# Patient Record
Sex: Male | Born: 1955
Health system: Southern US, Community
[De-identification: ages and names within clinical notes are randomized; demographics above are authoritative.]

## PROBLEM LIST (undated history)

## (undated) DIAGNOSIS — I10 Essential (primary) hypertension: Secondary | ICD-10-CM

## (undated) DIAGNOSIS — I2510 Atherosclerotic heart disease of native coronary artery without angina pectoris: Secondary | ICD-10-CM

## (undated) DIAGNOSIS — K219 Gastro-esophageal reflux disease without esophagitis: Secondary | ICD-10-CM

## (undated) DIAGNOSIS — E785 Hyperlipidemia, unspecified: Secondary | ICD-10-CM

## (undated) DIAGNOSIS — L570 Actinic keratosis: Secondary | ICD-10-CM

## (undated) DIAGNOSIS — C801 Malignant (primary) neoplasm, unspecified: Secondary | ICD-10-CM

## (undated) DIAGNOSIS — I219 Acute myocardial infarction, unspecified: Secondary | ICD-10-CM

## (undated) HISTORY — PX: MELANOMA EXCISION: SHX5266

## (undated) HISTORY — DX: Actinic keratosis: L57.0

## (undated) HISTORY — PX: CORONARY ARTERY BYPASS GRAFT: SHX141

## (undated) HISTORY — PX: MICROLARYNGOSCOPY WITH CO2 LASER AND EXCISION OF VOCAL CORD LESION: SHX5970

---

## 2001-05-15 ENCOUNTER — Encounter: Admission: RE | Admit: 2001-05-15 | Discharge: 2001-06-05 | Payer: Self-pay | Admitting: Otolaryngology

## 2001-08-18 ENCOUNTER — Ambulatory Visit (HOSPITAL_BASED_OUTPATIENT_CLINIC_OR_DEPARTMENT_OTHER): Admission: RE | Admit: 2001-08-18 | Discharge: 2001-08-18 | Payer: Self-pay | Admitting: Otolaryngology

## 2001-09-04 ENCOUNTER — Encounter: Admission: RE | Admit: 2001-09-04 | Discharge: 2001-09-05 | Payer: Self-pay | Admitting: Otolaryngology

## 2004-10-07 ENCOUNTER — Inpatient Hospital Stay: Payer: Self-pay | Admitting: Cardiology

## 2004-10-08 ENCOUNTER — Inpatient Hospital Stay (HOSPITAL_COMMUNITY)
Admission: AD | Admit: 2004-10-08 | Discharge: 2004-10-14 | Payer: Self-pay | Admitting: Thoracic Surgery (Cardiothoracic Vascular Surgery)

## 2004-11-05 ENCOUNTER — Encounter
Admission: RE | Admit: 2004-11-05 | Discharge: 2004-11-05 | Payer: Self-pay | Admitting: Thoracic Surgery (Cardiothoracic Vascular Surgery)

## 2004-11-17 ENCOUNTER — Encounter: Payer: Self-pay | Admitting: Cardiology

## 2004-12-16 ENCOUNTER — Encounter: Payer: Self-pay | Admitting: Cardiology

## 2005-01-13 ENCOUNTER — Encounter: Payer: Self-pay | Admitting: Cardiology

## 2005-01-25 ENCOUNTER — Encounter: Payer: Self-pay | Admitting: Cardiology

## 2006-06-01 IMAGING — CR DG CHEST 1V PORT
1 series · 1 of 1 positions shown · non-contrast
Comparison: 10/08/04.

CLINICAL DATA: Postop CABG.  
 PORTABLE CHEST 10/09/04:
 Exam at 2399 hours.

[view not recorded]
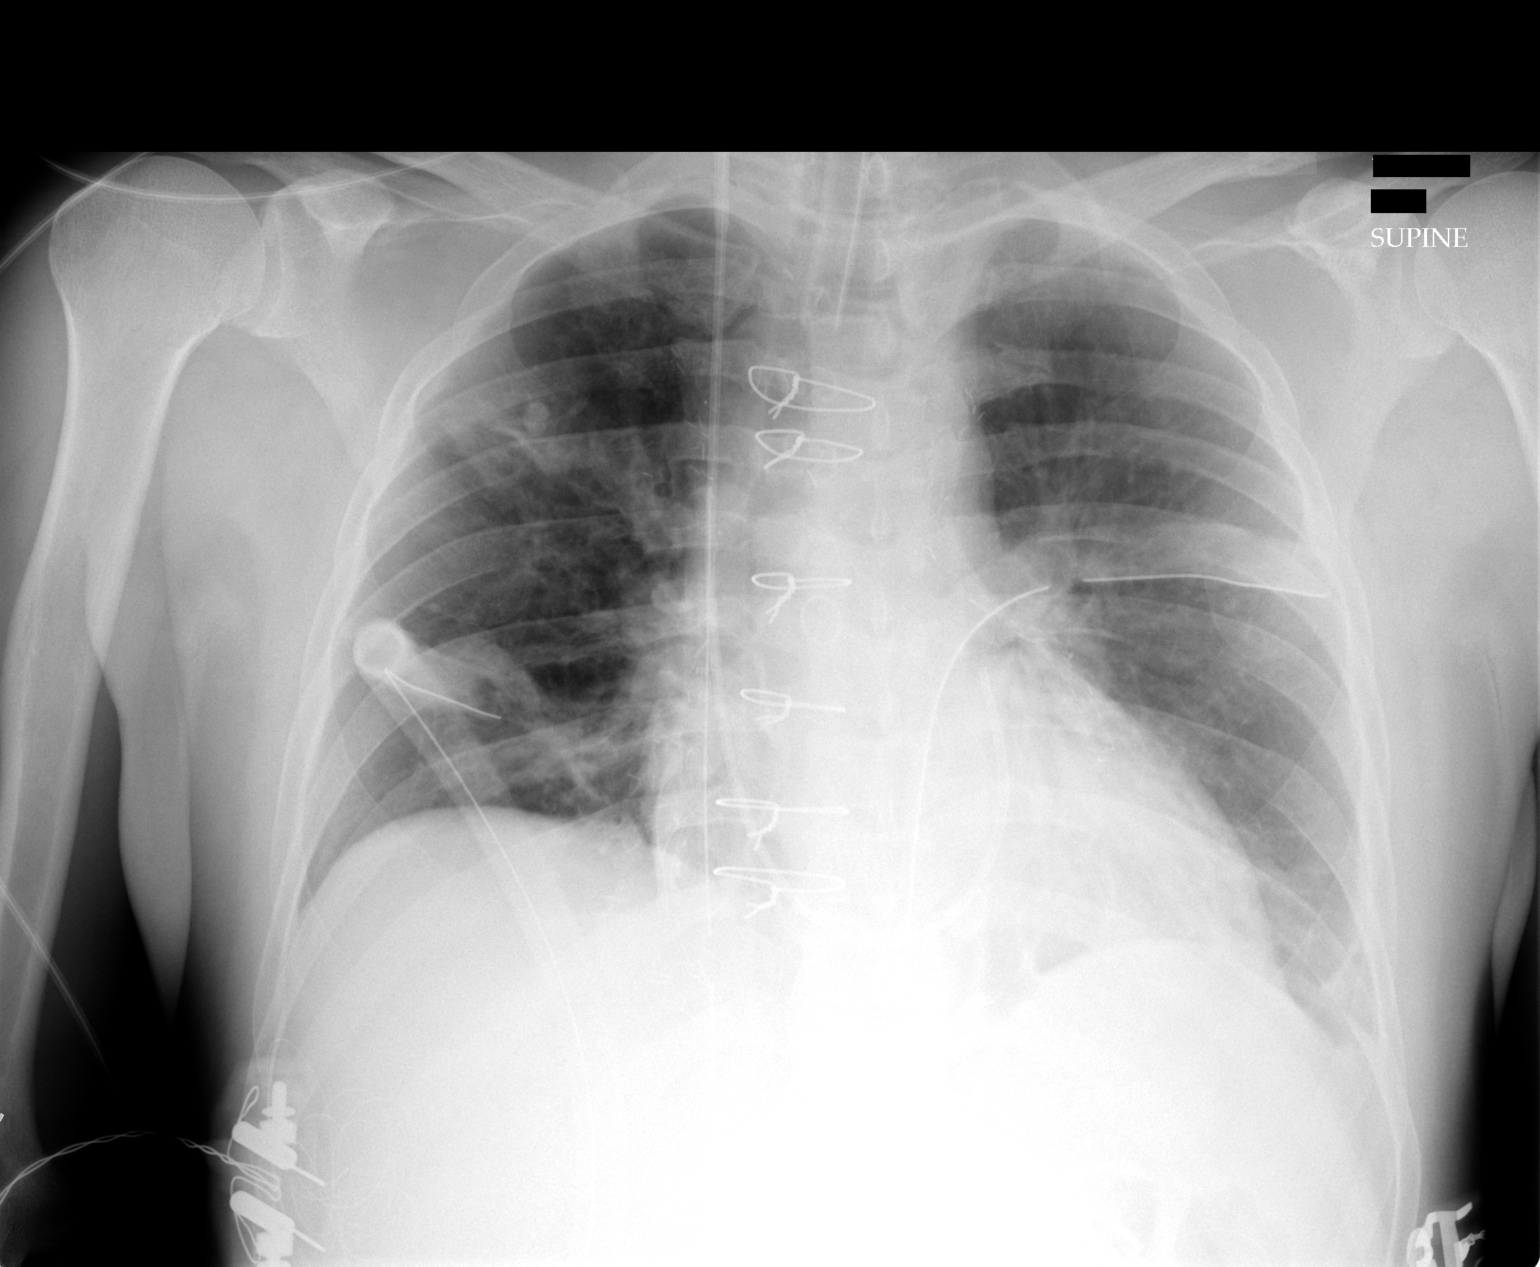

[1 of 1 positions shown; findings below may reference images not displayed]

FINDINGS: The patient has undergone interval median sternotomy and CABG.  Tip of the endotracheal tube is in the mid trachea.  There is a right IJ Swan-Ganz catheter with its tip in the right ventricular outflow tract.  Bilateral chest tubes are in place associated with scattered atelectasis bilaterally.  There is no edema or pneumothorax.
IMPRESSION: Scattered atelectasis status post interval CABG.  No pneumothorax or edema.

## 2006-06-02 IMAGING — CR DG CHEST 1V PORT
1 series · 1 of 1 positions shown · non-contrast
Comparison: none

HISTORY: Coronary artery disease status post CABG

[view not recorded]
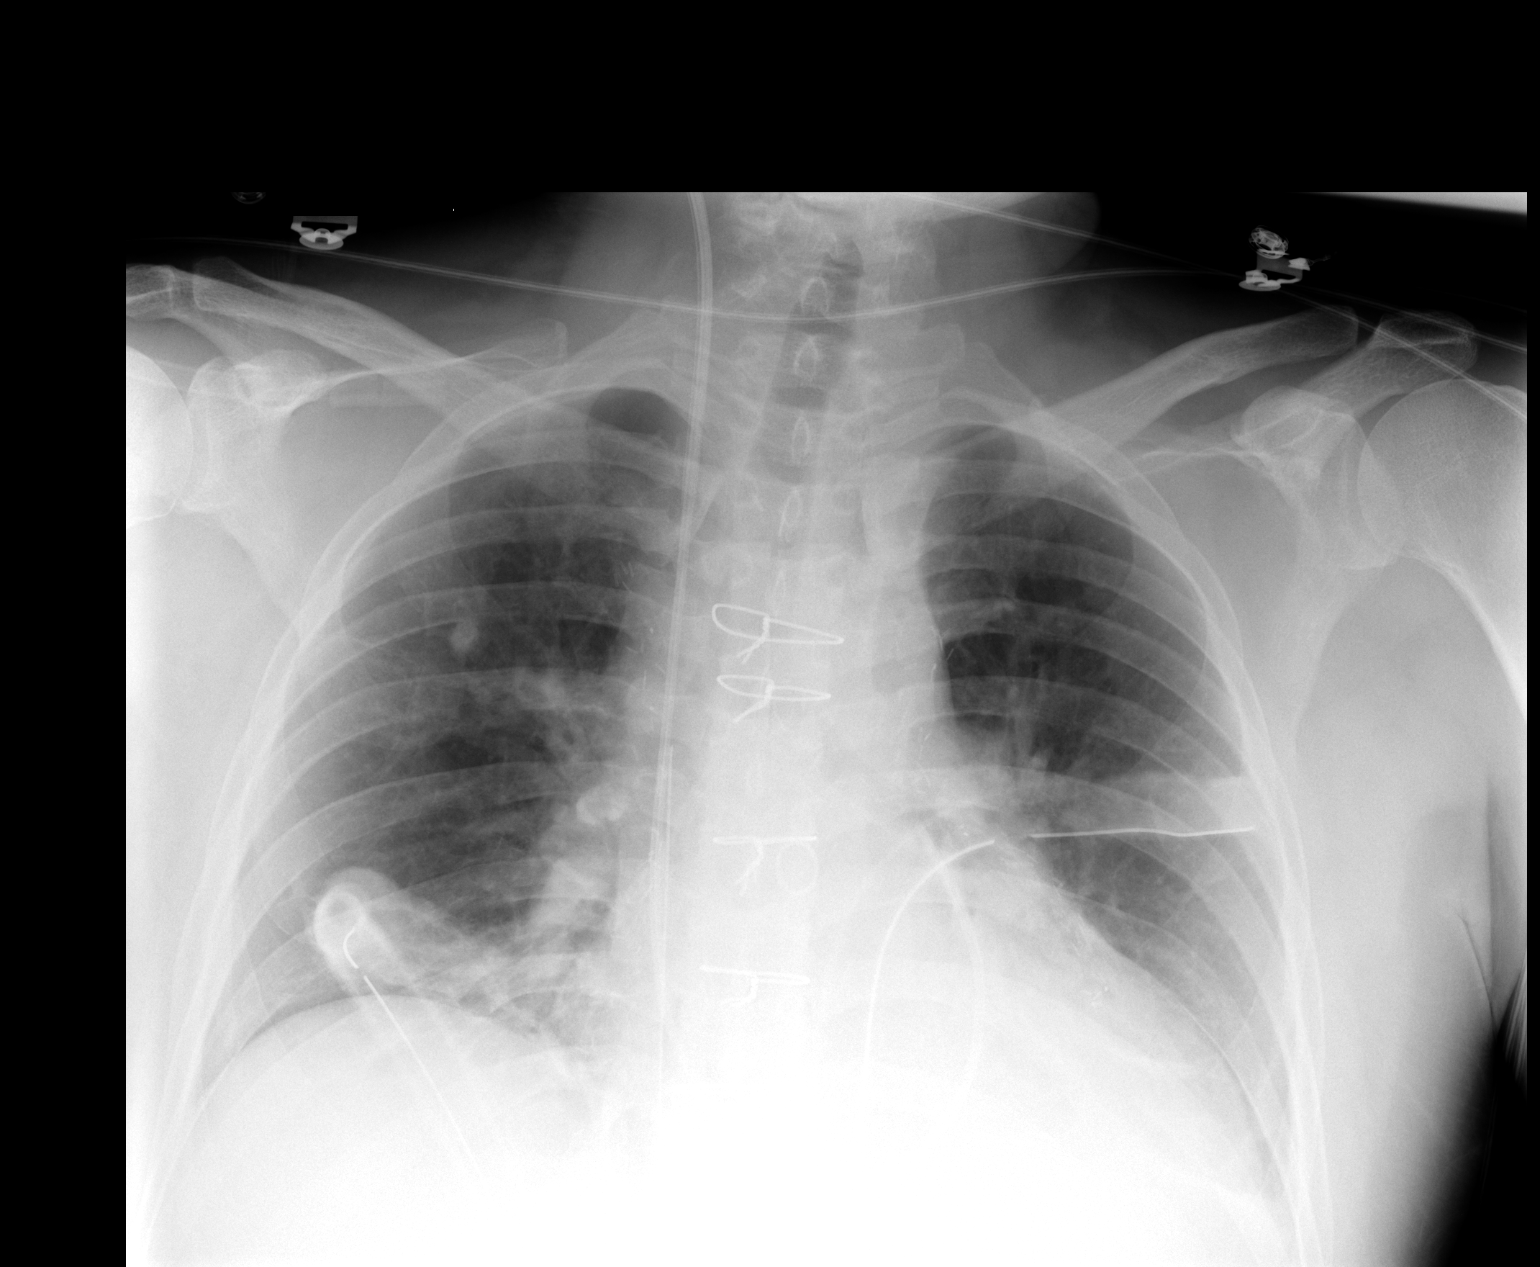

[1 of 1 positions shown; findings below may reference images not displayed]

PORTABLE CHEST ONE VIEW:

Portable exam 4944 hours compared to 10/09/2004.
Endotracheal tube removed.
Bilateral thoracostomy tubes, mediastinal drain, and right jugular Swan-Ganz
catheter stable.
Cardiomegaly status post CABG.
Mild vascular congestion.
Bibasilar atelectasis.
Calcified granuloma right upper lobe with probable calcified right hilar lymph
node.
No definite pneumothorax.
IMPRESSION: Bibasilar atelectasis left greater than right, increased since previous study,
particularly at left base.

## 2006-06-03 IMAGING — CR DG CHEST 2V
2 series · 2 of 2 positions shown · non-contrast
Comparison: 10/10/04.

CLINICAL DATA: Coronary artery disease.  
 CHEST ? TWO VIEWS 10/11/04:

[view not recorded (1 of 2)]
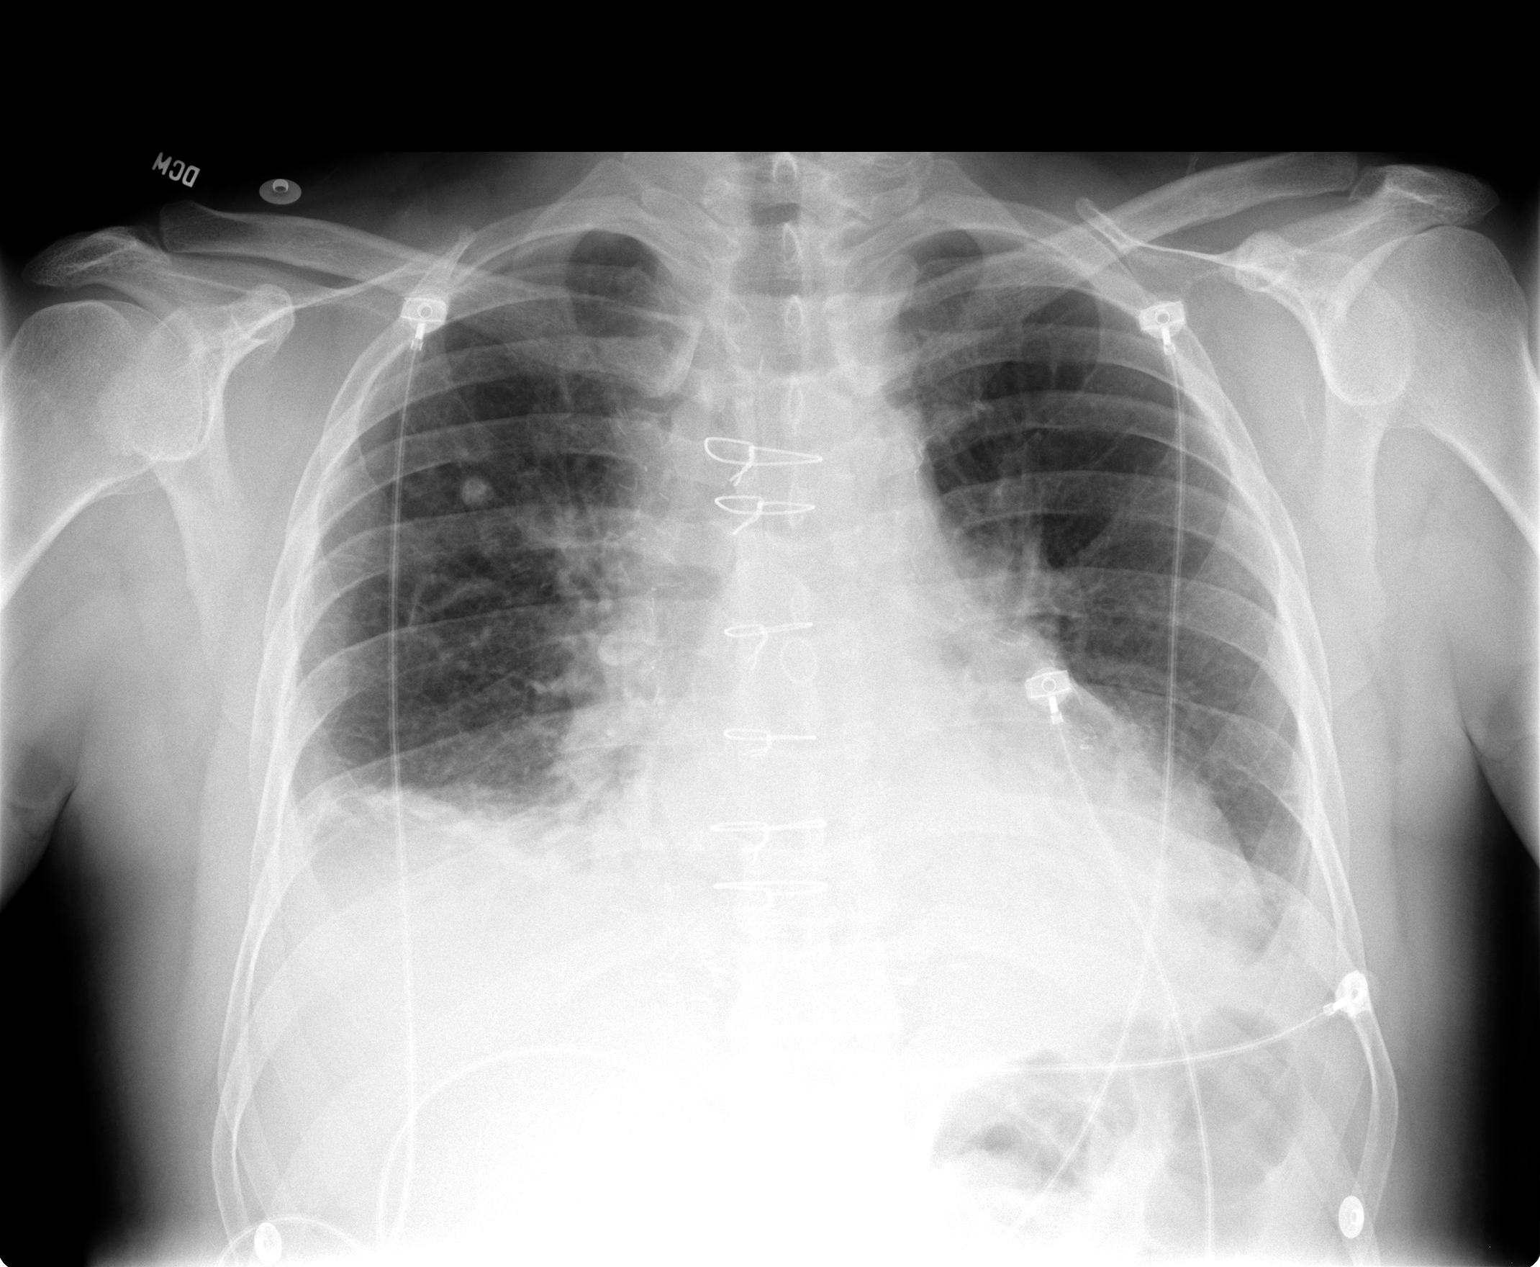

[view not recorded (2 of 2)]
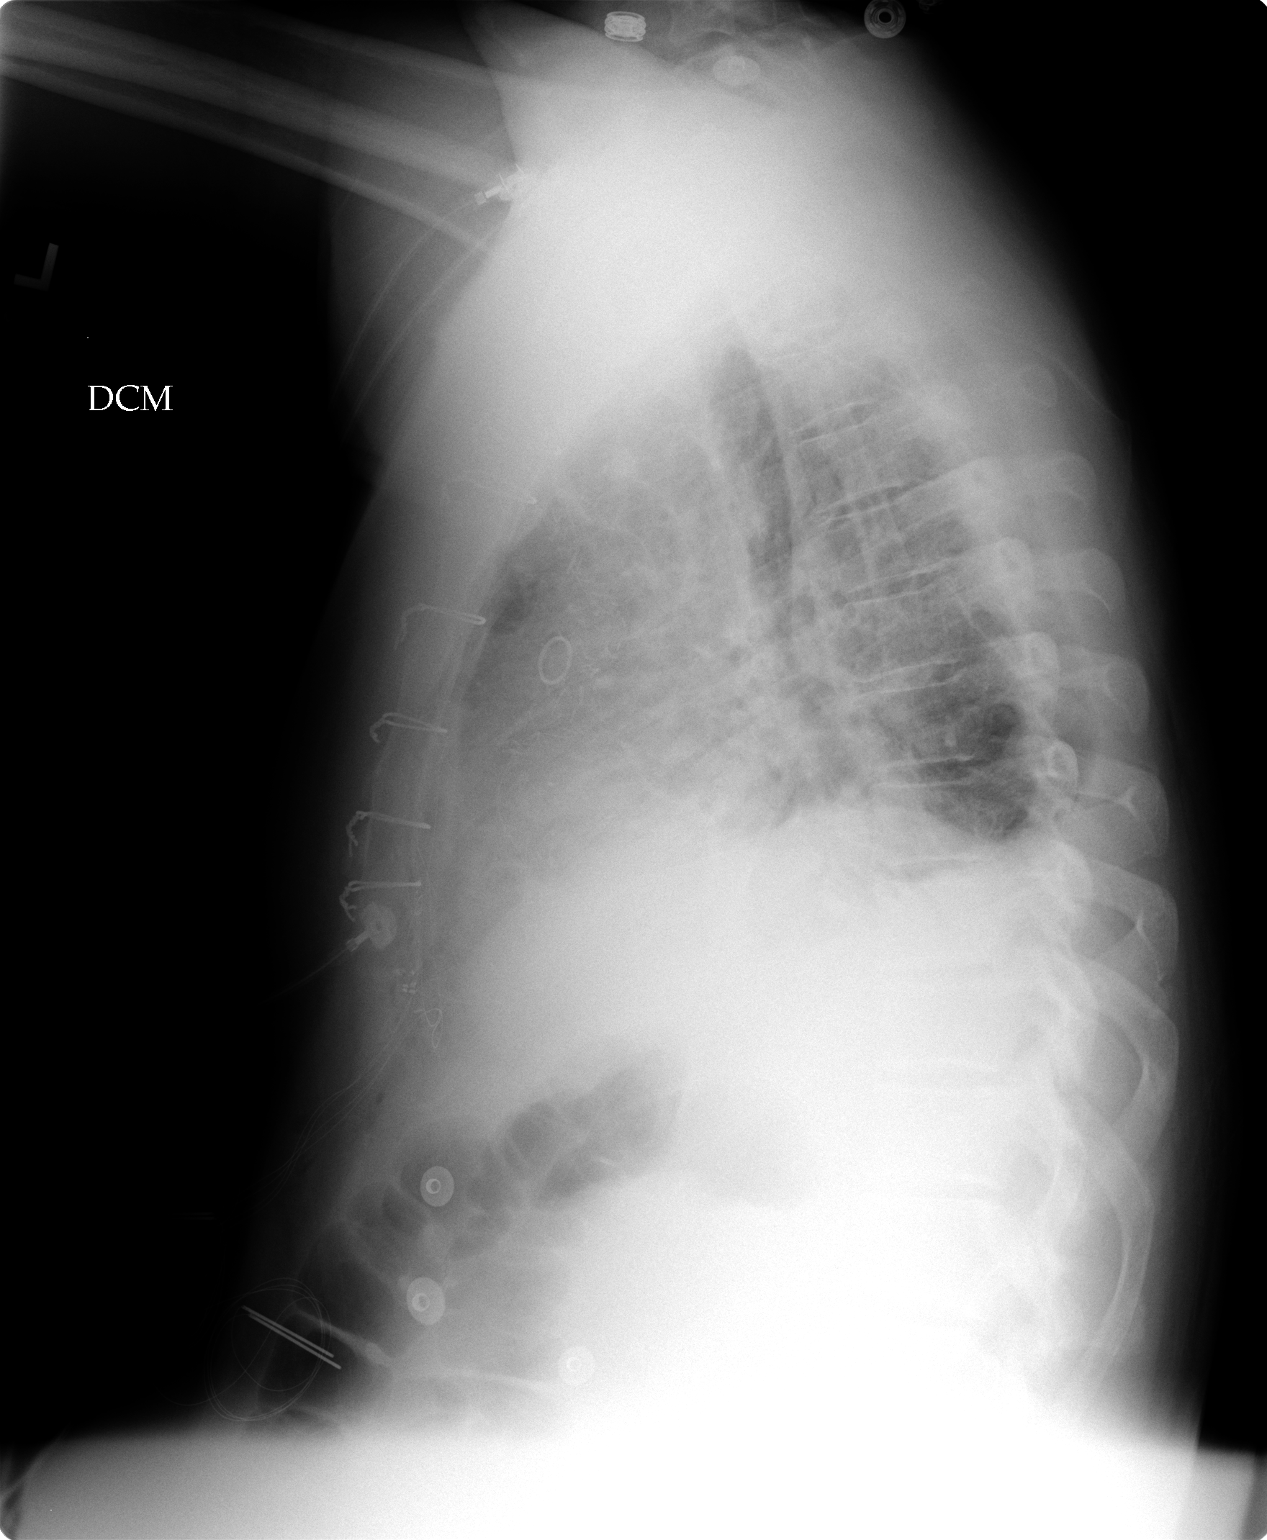

[2 of 2 positions shown; findings below may reference images not displayed]

FINDINGS: The left chest tube has been removed with no pneumothorax.  Right IJ Del and mediastinal drains have also removed.  Changes of CABG.  Bilateral pleural effusions with bibasilar atelectasis/consolidation, slightly increased since previous exam.  Right chest tube has been removed with no pneumothorax.  There is mild enlargement of the cardiac silhouette as before.
IMPRESSION: 1.  Bilateral chest tube removal with no pneumothorax. 
 2.  Bilateral pleural effusions, with some interval increase in bibasilar atelectasis/infiltrate since previous days portable study.

## 2008-04-15 DIAGNOSIS — D239 Other benign neoplasm of skin, unspecified: Secondary | ICD-10-CM

## 2008-04-15 HISTORY — DX: Other benign neoplasm of skin, unspecified: D23.9

## 2009-04-21 ENCOUNTER — Ambulatory Visit: Payer: Self-pay | Admitting: Gastroenterology

## 2009-06-04 DIAGNOSIS — C439 Malignant melanoma of skin, unspecified: Secondary | ICD-10-CM

## 2009-06-04 HISTORY — DX: Malignant melanoma of skin, unspecified: C43.9

## 2011-05-03 ENCOUNTER — Ambulatory Visit: Payer: Self-pay | Admitting: Family Medicine

## 2014-01-28 DIAGNOSIS — I10 Essential (primary) hypertension: Secondary | ICD-10-CM | POA: Insufficient documentation

## 2014-01-28 DIAGNOSIS — E782 Mixed hyperlipidemia: Secondary | ICD-10-CM | POA: Insufficient documentation

## 2014-01-28 DIAGNOSIS — I25810 Atherosclerosis of coronary artery bypass graft(s) without angina pectoris: Secondary | ICD-10-CM | POA: Insufficient documentation

## 2014-03-21 DIAGNOSIS — F32A Depression, unspecified: Secondary | ICD-10-CM | POA: Insufficient documentation

## 2015-03-11 DIAGNOSIS — M6530 Trigger finger, unspecified finger: Secondary | ICD-10-CM | POA: Insufficient documentation

## 2015-11-18 MED FILL — OMEGA-3 ETHYL ESTERS 1 GM C: 1 | 30 days supply | Qty: 120 | Fill #0

## 2015-11-24 DIAGNOSIS — L859 Epidermal thickening, unspecified: Secondary | ICD-10-CM | POA: Diagnosis not present

## 2015-11-24 DIAGNOSIS — D1800 Hemangioma unspecified site: Secondary | ICD-10-CM | POA: Diagnosis not present

## 2015-11-24 DIAGNOSIS — L821 Other seborrheic keratosis: Secondary | ICD-10-CM | POA: Diagnosis not present

## 2015-11-24 DIAGNOSIS — L820 Inflamed seborrheic keratosis: Secondary | ICD-10-CM | POA: Diagnosis not present

## 2015-11-24 DIAGNOSIS — D229 Melanocytic nevi, unspecified: Secondary | ICD-10-CM | POA: Diagnosis not present

## 2015-11-24 DIAGNOSIS — L739 Follicular disorder, unspecified: Secondary | ICD-10-CM | POA: Diagnosis not present

## 2015-11-24 DIAGNOSIS — L988 Other specified disorders of the skin and subcutaneous tissue: Secondary | ICD-10-CM | POA: Diagnosis not present

## 2015-11-24 DIAGNOSIS — D485 Neoplasm of uncertain behavior of skin: Secondary | ICD-10-CM | POA: Diagnosis not present

## 2015-11-24 DIAGNOSIS — Z85820 Personal history of malignant melanoma of skin: Secondary | ICD-10-CM | POA: Diagnosis not present

## 2015-11-24 DIAGNOSIS — Z1283 Encounter for screening for malignant neoplasm of skin: Secondary | ICD-10-CM | POA: Diagnosis not present

## 2015-11-24 DIAGNOSIS — L570 Actinic keratosis: Secondary | ICD-10-CM | POA: Diagnosis not present

## 2015-11-24 HISTORY — DX: Melanocytic nevi, unspecified: D22.9

## 2015-12-12 MED FILL — OMEGA-3 ETHYL ESTERS 1 GM C: 1 | 90 days supply | Qty: 360 | Fill #1

## 2015-12-12 MED FILL — SIMVASTATIN 40 MG TABLET: 40 | 90 days supply | Qty: 90 | Fill #2

## 2015-12-12 MED FILL — METOPROLOL TARTRATE 25 MG T: 25 | 90 days supply | Qty: 90 | Fill #1

## 2015-12-16 DIAGNOSIS — L905 Scar conditions and fibrosis of skin: Secondary | ICD-10-CM | POA: Diagnosis not present

## 2015-12-16 DIAGNOSIS — D485 Neoplasm of uncertain behavior of skin: Secondary | ICD-10-CM | POA: Diagnosis not present

## 2016-02-04 DIAGNOSIS — R21 Rash and other nonspecific skin eruption: Secondary | ICD-10-CM | POA: Diagnosis not present

## 2016-02-04 DIAGNOSIS — L239 Allergic contact dermatitis, unspecified cause: Secondary | ICD-10-CM | POA: Diagnosis not present

## 2016-02-04 MED FILL — LEVITRA 20 MG TABLET: 20 | 30 days supply | Qty: 6 | Fill #3

## 2016-02-11 DIAGNOSIS — I25719 Atherosclerosis of autologous vein coronary artery bypass graft(s) with unspecified angina pectoris: Secondary | ICD-10-CM | POA: Diagnosis not present

## 2016-02-11 DIAGNOSIS — R079 Chest pain, unspecified: Secondary | ICD-10-CM | POA: Diagnosis not present

## 2016-03-03 DIAGNOSIS — D485 Neoplasm of uncertain behavior of skin: Secondary | ICD-10-CM | POA: Diagnosis not present

## 2016-03-03 DIAGNOSIS — L570 Actinic keratosis: Secondary | ICD-10-CM | POA: Diagnosis not present

## 2016-03-03 DIAGNOSIS — L578 Other skin changes due to chronic exposure to nonionizing radiation: Secondary | ICD-10-CM | POA: Diagnosis not present

## 2016-03-03 DIAGNOSIS — L259 Unspecified contact dermatitis, unspecified cause: Secondary | ICD-10-CM | POA: Diagnosis not present

## 2016-03-09 DIAGNOSIS — M6530 Trigger finger, unspecified finger: Secondary | ICD-10-CM | POA: Diagnosis not present

## 2016-03-10 MED FILL — SIMVASTATIN 40 MG TABLET: 40 | 90 days supply | Qty: 90 | Fill #3

## 2016-03-10 MED FILL — CITALOPRAM HBR 10 MG TABLET: 10 | 90 days supply | Qty: 90 | Fill #1

## 2016-03-12 MED FILL — METOPROLOL TARTRATE 25 MG T: 25 | 30 days supply | Qty: 30 | Fill #0

## 2016-03-30 DIAGNOSIS — L570 Actinic keratosis: Secondary | ICD-10-CM | POA: Diagnosis not present

## 2016-03-31 DIAGNOSIS — I10 Essential (primary) hypertension: Secondary | ICD-10-CM | POA: Diagnosis not present

## 2016-03-31 DIAGNOSIS — E782 Mixed hyperlipidemia: Secondary | ICD-10-CM | POA: Diagnosis not present

## 2016-03-31 DIAGNOSIS — I25719 Atherosclerosis of autologous vein coronary artery bypass graft(s) with unspecified angina pectoris: Secondary | ICD-10-CM | POA: Diagnosis not present

## 2016-04-09 MED FILL — LEVITRA 20 MG TABLET: 20 | 30 days supply | Qty: 6 | Fill #4

## 2016-04-09 MED FILL — METOPROLOL TARTRATE 25 MG T: 25 | 30 days supply | Qty: 30 | Fill #1

## 2016-04-29 ENCOUNTER — Inpatient Hospital Stay: Admission: RE | Admit: 2016-04-29 | Payer: Self-pay | Source: Ambulatory Visit

## 2016-05-04 ENCOUNTER — Encounter
Admission: RE | Admit: 2016-05-04 | Discharge: 2016-05-04 | Disposition: A | Discharge status: Home / Self Care | Payer: 59 | Source: Ambulatory Visit | Attending: Orthopedic Surgery | Admitting: Orthopedic Surgery

## 2016-05-04 DIAGNOSIS — K219 Gastro-esophageal reflux disease without esophagitis: Secondary | ICD-10-CM | POA: Diagnosis not present

## 2016-05-04 DIAGNOSIS — I2510 Atherosclerotic heart disease of native coronary artery without angina pectoris: Secondary | ICD-10-CM | POA: Diagnosis not present

## 2016-05-04 DIAGNOSIS — Z951 Presence of aortocoronary bypass graft: Secondary | ICD-10-CM | POA: Diagnosis not present

## 2016-05-04 DIAGNOSIS — M654 Radial styloid tenosynovitis [de Quervain]: Secondary | ICD-10-CM | POA: Diagnosis not present

## 2016-05-04 DIAGNOSIS — I252 Old myocardial infarction: Secondary | ICD-10-CM | POA: Diagnosis not present

## 2016-05-04 DIAGNOSIS — I10 Essential (primary) hypertension: Secondary | ICD-10-CM | POA: Diagnosis not present

## 2016-05-04 HISTORY — DX: Hyperlipidemia, unspecified: E78.5

## 2016-05-04 HISTORY — DX: Malignant (primary) neoplasm, unspecified: C80.1

## 2016-05-04 HISTORY — DX: Atherosclerotic heart disease of native coronary artery without angina pectoris: I25.10

## 2016-05-04 HISTORY — DX: Essential (primary) hypertension: I10

## 2016-05-04 HISTORY — DX: Gastro-esophageal reflux disease without esophagitis: K21.9

## 2016-05-04 HISTORY — DX: Acute myocardial infarction, unspecified: I21.9

## 2016-05-04 LAB — CBC
HCT: 41.3 % (ref 40.0–52.0)
HEMOGLOBIN: 14.4 g/dL (ref 13.0–18.0)
MCH: 33.7 pg (ref 26.0–34.0)
MCHC: 34.7 g/dL (ref 32.0–36.0)
MCV: 96.9 fL (ref 80.0–100.0)
Platelets: 209 10*3/uL (ref 150–440)
RBC: 4.26 MIL/uL — ABNORMAL LOW (ref 4.40–5.90)
RDW: 12.7 % (ref 11.5–14.5)
WBC: 6.3 10*3/uL (ref 3.8–10.6)

## 2016-05-04 LAB — BASIC METABOLIC PANEL
Anion gap: 6 (ref 5–15)
BUN: 12 mg/dL (ref 6–20)
CO2: 30 mmol/L (ref 22–32)
Calcium: 9.4 mg/dL (ref 8.9–10.3)
Chloride: 103 mmol/L (ref 101–111)
Creatinine, Ser: 0.77 mg/dL (ref 0.61–1.24)
GFR calc Af Amer: >60 mL/min (ref >60)
GFR calc non Af Amer: >60 mL/min (ref >60)
Glucose, Bld: 95 mg/dL (ref 65–99)
POTASSIUM: 3.9 mmol/L (ref 3.5–5.1)
SODIUM: 139 mmol/L (ref 135–145)

## 2016-05-04 NOTE — Patient Instructions (Signed)
  Your procedure is scheduled on: Thurs 05/06/16 Report to Same Day Surgery 2nd floor medical mall To find out your arrival time please call [phone] between [place] on Wed 05/05/16  Remember: Instructions that are not followed completely may result in serious medical risk, up to and including death, or upon the discretion of your surgeon and anesthesiologist your surgery may need to be rescheduled.    _x___ 1. Do not eat food or drink liquids after midnight. No gum chewing or hard candies.     __x__ 2. No Alcohol for 24 hours before or after surgery.   __x__3. No Smoking for 24 prior to surgery.   ____  4. Bring all medications with you on the day of surgery if instructed.    __x__ 5. Notify your doctor if there is any change in your medical condition     (cold, fever, infections).     Do not wear jewelry, make-up, hairpins, clips or nail polish.  Do not wear lotions, powders, or perfumes. You may wear deodorant.  Do not shave 48 hours prior to surgery. Men may shave face and neck.  Do not bring valuables to the hospital.    [facility] is not responsible for any belongings or valuables.               Contacts, dentures or bridgework may not be worn into surgery.  Leave your suitcase in the car. After surgery it may be brought to your room.  For patients admitted to the hospital, discharge time is determined by your treatment team.   Patients discharged the day of surgery will not be allowed to drive home.    Please read over the following fact sheets that you were given:   [facility] Preparing for Surgery and or MRSA Information   _x___ Take these medicines the morning of surgery with A SIP OF WATER:    1. metoprolol succinate (TOPROL-XL) 25 MG 24 hr tablet  2.omeprazole (PRILOSEC) 20 MG capsule  3.  4.  5.  6.  ____ Fleet Enema (as directed)   _x___ Use CHG Soap or sage wipes as directed on instruction sheet   ____ Use inhalers on the day of surgery and bring to  hospital day of surgery  ____ Stop metformin 2 days prior to surgery    ____ Take 1/2 of usual insulin dose the night before surgery and none on the morning of           surgery.   __x__ Stop aspirin or coumadin, or plavix  Stop aspirin 325 MG EC tablet today  _x__ Stop Anti-inflammatories such as Advil, Aleve, Ibuprofen, Motrin, Naproxen,          Naprosyn, Goodies powders or aspirin products. Ok to take Tylenol.   __x__ Stop supplements until after surgery.  omega-3 acid ethyl esters (LOVAZA) 1 g capsule today  ____ Bring C-Pap to the hospital.

## 2016-05-05 NOTE — Pre-Procedure Instructions (Signed)
ETT showed excellent exercise ability with no arrhythmia or ischemia.   Ejection fraction normal at 62%.  No evidence of arrhythmia or ischemia  with exercise.  Negative sestamibi study.   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 Bartholome Bill   Clinical History: 60 y.o. year old male Vitals: Height: 30 in  Weight: 168 lb Cardiac risk factors include:    Hyperlipidemia, HTN, CABG and CAD    Procedure: The patient performed treadmill exercise using a Bruce protocol for 13:00  minutes. The exercise test was stopped due to fatigue.  Blood pressure  response was normal.  Rest HR: 57bpm Rest BP: 140/57mmHg Max HR: 137bpm Max BP: 152/57mmHg Mets:     17.20 % MAX HR:   84%  Stress Test Administered by: Oswald Hillock, CMA  ECG Interpretation: Rest ECG:  normal sinus rhythm, none Stress ECG:  sinus tachycardia, no arrhythmia or ischemia Recovery ECG:  normal sinus rhythm ECG Interpretation:  negative, no ECG changes.   Administrations This Visit    technetium Tc40m sestamibi (CARDIOLITE) injection [id number] millicurie    Admin Date Action Dose Route Administered By      [id number] Given [id number] millicurie Intravenous Kingsley Callander, CNMT            technetium Tc73m sestamibi (CARDIOLITE) injection [id number] millicurie    Admin Date Action Dose Route Administered By      [id number] Given [id number] millicurie Intravenous Kingsley Callander, CNMT              Gated post-stress perfusion imaging was performed 30 minutes after stress.  Rest images were performed 30 minutes after  injection.  Gated LV Analysis:  TID:  0.86  LVEF= 62%  FINDINGS: Regional wall motion:  reveals normal myocardial thickening and wall  motion. The overall quality of the study is excellent.   Artifacts noted: no Left ventricular cavity: normal.  Perfusion Analysis:  SPECT images demonstrate homogeneous tracer  distribution throughout the myocardium.     Status Results Details   Encounter Summary

## 2016-05-05 NOTE — Pre-Procedure Instructions (Signed)
Component Name Value Range  Vent Rate (bpm) 56   PR Interval (msec) 158   QRS Interval (msec) 96   QT Interval (msec) 438   QTc (msec) 422    Result Narrative  Sinus bradycardia Cannot rule out Inferior infarct , age undetermined Abnormal ECG When compared with ECG of 25-Sep-2014 10:59, No significant change was found I reviewed and concur with this report. Electronically signed [id number] MD, KEN [id number]) on 10/17/2015 8:10:46 AM   Status Results Details

## 2016-05-06 ENCOUNTER — Ambulatory Visit: Payer: 59 | Admitting: Anesthesiology

## 2016-05-06 ENCOUNTER — Ambulatory Visit
Admission: RE | Admit: 2016-05-06 | Discharge: 2016-05-06 | Disposition: A | Discharge status: Home / Self Care | Payer: 59 | Source: Ambulatory Visit | Attending: Orthopedic Surgery | Admitting: Orthopedic Surgery

## 2016-05-06 ENCOUNTER — Encounter
Admission: RE | Disposition: A | Discharge status: Home / Self Care | Payer: Self-pay | Source: Ambulatory Visit | Attending: Orthopedic Surgery

## 2016-05-06 ENCOUNTER — Encounter: Payer: Self-pay | Admitting: Anesthesiology

## 2016-05-06 DIAGNOSIS — Z951 Presence of aortocoronary bypass graft: Secondary | ICD-10-CM | POA: Insufficient documentation

## 2016-05-06 DIAGNOSIS — K219 Gastro-esophageal reflux disease without esophagitis: Secondary | ICD-10-CM | POA: Diagnosis not present

## 2016-05-06 DIAGNOSIS — I10 Essential (primary) hypertension: Secondary | ICD-10-CM | POA: Insufficient documentation

## 2016-05-06 DIAGNOSIS — I2510 Atherosclerotic heart disease of native coronary artery without angina pectoris: Secondary | ICD-10-CM | POA: Insufficient documentation

## 2016-05-06 DIAGNOSIS — I252 Old myocardial infarction: Secondary | ICD-10-CM | POA: Diagnosis not present

## 2016-05-06 DIAGNOSIS — M654 Radial styloid tenosynovitis [de Quervain]: Secondary | ICD-10-CM | POA: Diagnosis not present

## 2016-05-06 DIAGNOSIS — I25810 Atherosclerosis of coronary artery bypass graft(s) without angina pectoris: Secondary | ICD-10-CM | POA: Diagnosis not present

## 2016-05-06 HISTORY — PX: DORSAL COMPARTMENT RELEASE: SHX5039

## 2016-05-06 SURGERY — RELEASE, FIRST DORSAL COMPARTMENT, HAND
Anesthesia: General | Laterality: Right

## 2016-05-06 MED ORDER — ONDANSETRON HCL 4 MG/2ML IJ SOLN
INTRAMUSCULAR | Status: DC | PRN
  Administered 2016-05-06: 4 mg via INTRAVENOUS

## 2016-05-06 MED ORDER — BUPIVACAINE HCL (PF) 0.5 % IJ SOLN
INTRAMUSCULAR | Status: AC
  Filled 2016-05-06: qty 30

## 2016-05-06 MED ORDER — HYDROCODONE-ACETAMINOPHEN 5-325 MG PO TABS: 1.0000 | ORAL_TABLET | ORAL | Status: DC | PRN

## 2016-05-06 MED ORDER — ONDANSETRON HCL 4 MG/2ML IJ SOLN
4.0000 mg | Freq: Once | INTRAMUSCULAR | Status: DC | PRN
Start: 2016-05-06 — End: 2016-05-06

## 2016-05-06 MED ORDER — METOCLOPRAMIDE HCL 10 MG PO TABS: 5.0000 mg | ORAL_TABLET | Freq: Three times a day (TID) | ORAL | Status: DC | PRN

## 2016-05-06 MED ORDER — EPHEDRINE SULFATE 50 MG/ML IJ SOLN
INTRAMUSCULAR | Status: DC | PRN
  Administered 2016-05-06: 10 mg via INTRAVENOUS

## 2016-05-06 MED ORDER — DEXAMETHASONE SODIUM PHOSPHATE 10 MG/ML IJ SOLN
INTRAMUSCULAR | Status: DC | PRN
  Administered 2016-05-06: 10 mg via INTRAVENOUS

## 2016-05-06 MED ORDER — HYDROCODONE-ACETAMINOPHEN 5-325 MG PO TABS: 1.0000 | ORAL_TABLET | Freq: Four times a day (QID) | ORAL | Status: AC | PRN

## 2016-05-06 MED ORDER — MIDAZOLAM HCL 2 MG/2ML IJ SOLN
INTRAMUSCULAR | Status: DC | PRN
  Administered 2016-05-06: 2 mg via INTRAVENOUS

## 2016-05-06 MED ORDER — FENTANYL CITRATE (PF) 100 MCG/2ML IJ SOLN
INTRAMUSCULAR | Status: DC | PRN
  Administered 2016-05-06: 100 ug via INTRAVENOUS

## 2016-05-06 MED ORDER — METOCLOPRAMIDE HCL 5 MG/ML IJ SOLN: 5.0000 mg | Freq: Three times a day (TID) | INTRAMUSCULAR | Status: DC | PRN

## 2016-05-06 MED ORDER — FENTANYL CITRATE (PF) 100 MCG/2ML IJ SOLN: 25.0000 ug | INTRAMUSCULAR | Status: DC | PRN

## 2016-05-06 MED ORDER — PHENYLEPHRINE HCL 10 MG/ML IJ SOLN
INTRAMUSCULAR | Status: DC | PRN
  Administered 2016-05-06 (×6): 100 ug via INTRAVENOUS

## 2016-05-06 MED ORDER — NEOMYCIN-POLYMYXIN B GU 40-200000 IR SOLN
Status: AC
  Filled 2016-05-06: qty 1

## 2016-05-06 MED ORDER — BUPIVACAINE HCL (PF) 0.5 % IJ SOLN
INTRAMUSCULAR | Status: DC | PRN
Start: 2016-05-06 — End: 2016-05-06
  Administered 2016-05-06: 5 mL

## 2016-05-06 MED ORDER — NEOMYCIN-POLYMYXIN B GU 40-200000 IR SOLN
Status: DC | PRN
  Administered 2016-05-06: 2 mL

## 2016-05-06 MED ORDER — ONDANSETRON HCL 4 MG PO TABS
4.0000 mg | ORAL_TABLET | Freq: Four times a day (QID) | ORAL | Status: DC | PRN
Start: 2016-05-06 — End: 2016-05-06

## 2016-05-06 MED ORDER — ONDANSETRON HCL 4 MG/2ML IJ SOLN: 4.0000 mg | Freq: Four times a day (QID) | INTRAMUSCULAR | Status: DC | PRN

## 2016-05-06 MED ORDER — PROPOFOL 10 MG/ML IV BOLUS
INTRAVENOUS | Status: DC | PRN
  Administered 2016-05-06: 170 mg via INTRAVENOUS

## 2016-05-06 MED ORDER — LIDOCAINE HCL (CARDIAC) 20 MG/ML IV SOLN
INTRAVENOUS | Status: DC | PRN
  Administered 2016-05-06: 50 mg via INTRAVENOUS

## 2016-05-06 MED ORDER — LACTATED RINGERS IV SOLN
INTRAVENOUS | Status: DC
  Administered 2016-05-06 (×2): via INTRAVENOUS

## 2016-05-06 MED ORDER — SODIUM CHLORIDE 0.9 % IV SOLN: INTRAVENOUS | Status: DC

## 2016-05-06 SURGICAL SUPPLY — 29 items
BANDAGE ELASTIC 3 CLIP NS LF (GAUZE/BANDAGES/DRESSINGS) ×2 IMPLANT
BNDG ESMARK 4X12 TAN STRL LF (GAUZE/BANDAGES/DRESSINGS) ×2 IMPLANT
CANISTER SUCT 1200ML W/VALVE (MISCELLANEOUS) ×2 IMPLANT
CAST PADDING 3X4FT ST 30246 (SOFTGOODS) ×1
CHLORAPREP W/TINT 26ML (MISCELLANEOUS) ×2 IMPLANT
CUFF TOURN 18 STER (MISCELLANEOUS) IMPLANT
GAUZE PETRO XEROFOAM 1X8 (MISCELLANEOUS) ×2 IMPLANT
GAUZE SPONGE 4X4 12PLY STRL (GAUZE/BANDAGES/DRESSINGS) ×2 IMPLANT
GAUZE XEROFORM 4X4 STRL (GAUZE/BANDAGES/DRESSINGS) ×1 IMPLANT
GLOVE BIOGEL PI IND STRL 9 (GLOVE) ×1 IMPLANT
GLOVE BIOGEL PI INDICATOR 9 (GLOVE) ×1
GLOVE SURG ORTHO 9.0 STRL STRW (GLOVE) ×2 IMPLANT
GOWN STRL REUS W/ TWL LRG LVL3 (GOWN DISPOSABLE) ×1 IMPLANT
GOWN STRL REUS W/TWL LRG LVL3 (GOWN DISPOSABLE) ×2
GOWN SURG XXL (GOWNS) ×2 IMPLANT
KIT RM TURNOVER STRD PROC AR (KITS) ×2 IMPLANT
NS IRRIG 500ML POUR BTL (IV SOLUTION) ×2 IMPLANT
PACK EXTREMITY ARMC (MISCELLANEOUS) ×2 IMPLANT
PAD CAST CTTN 3X4 STRL (SOFTGOODS) ×1 IMPLANT
PAD GROUND ADULT SPLIT (MISCELLANEOUS) ×2 IMPLANT
PADDING CAST COTTON 3X4 STRL (SOFTGOODS) ×1
SPLINT CAST 1 STEP 3X12 (MISCELLANEOUS) ×2 IMPLANT
STOCKINETTE 48X4 2 PLY STRL (GAUZE/BANDAGES/DRESSINGS) ×1 IMPLANT
STOCKINETTE STRL 4IN 9604848 (GAUZE/BANDAGES/DRESSINGS) ×2 IMPLANT
SUT ETHILON 4-0 (SUTURE) ×2
SUT ETHILON 4-0 FS2 18XMFL BLK (SUTURE) ×1
SUT VIC AB 3-0 SH 27 (SUTURE) ×2
SUT VIC AB 3-0 SH 27X BRD (SUTURE) ×1 IMPLANT
SUTURE ETHLN 4-0 FS2 18XMF BLK (SUTURE) ×1 IMPLANT

## 2016-05-06 NOTE — Op Note (Signed)
05/06/2016  8:55 AM  PATIENT:  Carlos Blair  60 y.o. male  PRE-OPERATIVE DIAGNOSIS:  DEQUERVAINS RIGHT WRIST  POST-OPERATIVE DIAGNOSIS:  DEQUERVAINS RIGHT WRIST  PROCEDURE:  Procedure(s): RELEASE DORSAL COMPARTMENT (DEQUERVAIN) (Right)  SURGEON: Laurene Footman, MD  ASSISTANTS: none  ANESTHESIA:   general  EBL:  Total I/O In: 700 [I.V.:700] Out: 5 [Blood:5]  BLOOD ADMINISTERED:none  DRAINS: none   LOCAL MEDICATIONS USED:  MARCAINE     SPECIMEN:  No Specimen  DISPOSITION OF SPECIMEN:  N/A  COUNTS:  YES  TOURNIQUET:   Total Tourniquet Time Documented: Upper Arm (Right) - 14 minutes Total: Upper Arm (Right) - 14 minutes   IMPLANTS: none  DICTATION: .Dragon Dictation patient brought the operating room and after adequate anesthesia was obtained the right arm was prepped and draped in sterile fashion. After patient identification and timeout procedures were completed, tourniquet was raised. Proximally 1 cm incision was made over the radial styloid and care was taken to preserve subcutaneous nerves. The extensor retinaculum was identified and released the there was quite a bit of compression on the tendons visible without much fluid releases carried out proximally and distally in both tendons identified and completely released. With some motion there did not appear to be a constriction or restriction of movement. The wound was irrigated and then closed with simple 4-0 nylon skin sutures followed by infiltration of 5 cc half percent Sensorcaine to give postop analgesia. Xeroform 4 x 4 web roll and Ace wrap applied and tourniquet let down  PLAN OF CARE: Discharge to home after PACU  PATIENT DISPOSITION:  PACU - hemodynamically stable.

## 2016-05-06 NOTE — Anesthesia Procedure Notes (Signed)
Procedure Name: LMA Insertion Date/Time: 05/06/2016 8:27 AM Performed by: Nelda Marseille Pre-anesthesia Checklist: Patient identified, Patient being monitored, Timeout performed, Emergency Drugs available and Suction available Patient Re-evaluated:Patient Re-evaluated prior to inductionOxygen Delivery Method: Circle system utilized Preoxygenation: Pre-oxygenation with 100% oxygen Intubation Type: IV induction Ventilation: Mask ventilation without difficulty LMA: LMA inserted LMA Size: 4.5 Tube type: Oral Number of attempts: 1 Placement Confirmation: positive ETCO2 and breath sounds checked- equal and bilateral Tube secured with: Tape Dental Injury: Teeth and Oropharynx as per pre-operative assessment

## 2016-05-06 NOTE — Transfer of Care (Signed)
Immediate Anesthesia Transfer of Care Note  Patient: Carlos Blair  Procedure(s) Performed: Procedure(s): RELEASE DORSAL COMPARTMENT (DEQUERVAIN) (Right)  Patient Location: PACU  Anesthesia Type:General  Level of Consciousness: sedated  Airway & Oxygen Therapy: Patient Spontanous Breathing and Patient connected to face mask oxygen  Post-op Assessment: Report given to RN and Post -op Vital signs reviewed and stable  Post vital signs: Reviewed and stable  Last Vitals:  Filed Vitals:   05/06/16 0722  BP: 119/70  Pulse: 55  Temp: 36.8 C  Resp: 16    Last Pain: There were no vitals filed for this visit.       Complications: No apparent anesthesia complications

## 2016-05-06 NOTE — Discharge Instructions (Addendum)
Loosen Ace wrap if fingers swell, otherwise leave bandage in place and work fingers is much as possibleAMBULATORY SURGERY  DISCHARGE INSTRUCTIONS   1) The drugs that you were given will stay in your system until tomorrow so for the next 24 hours you should not:  A) Drive an automobile B) Make any legal decisions C) Drink any alcoholic beverage   2) You may resume regular meals tomorrow.  Today it is better to start with liquids and gradually work up to solid foods.  You may eat anything you prefer, but it is better to start with liquids, then soup and crackers, and gradually work up to solid foods.   3) Please notify your doctor immediately if you have any unusual bleeding, trouble breathing, redness and pain at the surgery site, drainage, fever, or pain not relieved by medication.    4) Additional Instructions:        Please contact your physician with any problems or Same Day Surgery at [phone], Monday through Friday 6 am to 4 pm, or [place] [facility] number at [phone].

## 2016-05-06 NOTE — H&P (Signed)
Reviewed paper H+P, will be scanned into chart. No changes noted.  

## 2016-05-06 NOTE — Anesthesia Preprocedure Evaluation (Signed)
Anesthesia Evaluation  Patient identified by MRN, date of birth, ID band Patient awake    Reviewed: Allergy & Precautions, NPO status , Patient's Chart, lab work & pertinent test results, reviewed documented beta blocker date and time   Airway Mallampati: II  TM Distance: >3 FB     Dental  (+) Chipped   Pulmonary           Cardiovascular hypertension, Pt. on medications and Pt. on home beta blockers + CAD, + Past MI and + CABG       Neuro/Psych    GI/Hepatic GERD  Controlled,  Endo/Other    Renal/GU      Musculoskeletal   Abdominal   Peds  Hematology   Anesthesia Other Findings   Reproductive/Obstetrics                             Anesthesia Physical Anesthesia Plan  ASA: III  Anesthesia Plan: General   Post-op Pain Management:    Induction: Intravenous  Airway Management Planned: LMA  Additional Equipment:   Intra-op Plan:   Post-operative Plan:   Informed Consent: I have reviewed the patients History and Physical, chart, labs and discussed the procedure including the risks, benefits and alternatives for the proposed anesthesia with the patient or authorized representative who has indicated his/her understanding and acceptance.     Plan Discussed with: CRNA  Anesthesia Plan Comments:         Anesthesia Quick Evaluation

## 2016-05-07 ENCOUNTER — Encounter: Payer: Self-pay | Admitting: Orthopedic Surgery

## 2016-05-10 DIAGNOSIS — R972 Elevated prostate specific antigen [PSA]: Secondary | ICD-10-CM | POA: Diagnosis not present

## 2016-05-10 DIAGNOSIS — E784 Other hyperlipidemia: Secondary | ICD-10-CM | POA: Diagnosis not present

## 2016-05-10 DIAGNOSIS — I10 Essential (primary) hypertension: Secondary | ICD-10-CM | POA: Diagnosis not present

## 2016-05-10 MED FILL — METOPROLOL TARTRATE 25 MG T: 25 | 30 days supply | Qty: 30 | Fill #2

## 2016-05-11 NOTE — Anesthesia Postprocedure Evaluation (Signed)
Anesthesia Post Note  Patient: Carlos Blair  Procedure(s) Performed: Procedure(s) (LRB): RELEASE DORSAL COMPARTMENT (DEQUERVAIN) (Right)  Patient location during evaluation: PACU Anesthesia Type: General Level of consciousness: awake and alert Pain management: pain level controlled Vital Signs Assessment: post-procedure vital signs reviewed and stable Respiratory status: spontaneous breathing, nonlabored ventilation, respiratory function stable and patient connected to nasal cannula oxygen Cardiovascular status: blood pressure returned to baseline and stable Postop Assessment: no signs of nausea or vomiting Anesthetic complications: no    Last Vitals:  Filed Vitals:   05/06/16 0938 05/06/16 1021  BP: 129/69 132/79  Pulse: 57 57  Temp: 36.1 C   Resp: 16 16    Last Pain:  Filed Vitals:   05/06/16 1025  PainSc: 0-No pain                 Wyllow Seigler S

## 2016-05-17 MED FILL — OMEGA-3 ETHYL ESTERS 1 GM C: 1 | 60 days supply | Qty: 240 | Fill #2

## 2016-06-02 DIAGNOSIS — E785 Hyperlipidemia, unspecified: Secondary | ICD-10-CM | POA: Diagnosis not present

## 2016-06-02 DIAGNOSIS — Z0000 Encounter for general adult medical examination without abnormal findings: Secondary | ICD-10-CM | POA: Diagnosis not present

## 2016-06-02 DIAGNOSIS — I10 Essential (primary) hypertension: Secondary | ICD-10-CM | POA: Diagnosis not present

## 2016-06-08 MED FILL — SIMVASTATIN 40 MG TABLET: 40 | 90 days supply | Qty: 90 | Fill #4

## 2016-06-08 MED FILL — METOPROLOL TARTRATE 25 MG T: 25 | 30 days supply | Qty: 30 | Fill #3

## 2016-06-29 DIAGNOSIS — L812 Freckles: Secondary | ICD-10-CM | POA: Diagnosis not present

## 2016-06-29 DIAGNOSIS — Z85820 Personal history of malignant melanoma of skin: Secondary | ICD-10-CM | POA: Diagnosis not present

## 2016-06-29 DIAGNOSIS — L820 Inflamed seborrheic keratosis: Secondary | ICD-10-CM | POA: Diagnosis not present

## 2016-06-29 DIAGNOSIS — Z1283 Encounter for screening for malignant neoplasm of skin: Secondary | ICD-10-CM | POA: Diagnosis not present

## 2016-06-29 DIAGNOSIS — L578 Other skin changes due to chronic exposure to nonionizing radiation: Secondary | ICD-10-CM | POA: Diagnosis not present

## 2016-06-29 DIAGNOSIS — D1800 Hemangioma unspecified site: Secondary | ICD-10-CM | POA: Diagnosis not present

## 2016-06-29 DIAGNOSIS — D485 Neoplasm of uncertain behavior of skin: Secondary | ICD-10-CM | POA: Diagnosis not present

## 2016-06-29 DIAGNOSIS — L739 Follicular disorder, unspecified: Secondary | ICD-10-CM | POA: Diagnosis not present

## 2016-06-29 DIAGNOSIS — L821 Other seborrheic keratosis: Secondary | ICD-10-CM | POA: Diagnosis not present

## 2016-06-29 MED FILL — LEVITRA 20 MG TABLET: 20 | 30 days supply | Qty: 6 | Fill #0

## 2016-07-08 MED FILL — METOPROLOL TARTRATE 25 MG T: 25 | 30 days supply | Qty: 30 | Fill #4

## 2016-07-09 MED FILL — CITALOPRAM HBR 10 MG TABLET: 10 | 90 days supply | Qty: 90 | Fill #0

## 2016-08-09 MED FILL — METOPROLOL TARTRATE 25 MG T: 25 | 30 days supply | Qty: 30 | Fill #5

## 2016-08-30 MED FILL — OMEGA-3 ETHYL ESTERS 1 GM C: 1 | 90 days supply | Qty: 360 | Fill #0

## 2016-09-07 MED FILL — METOPROLOL TARTRATE 25 MG T: 25 | 30 days supply | Qty: 30 | Fill #6

## 2016-09-07 MED FILL — SIMVASTATIN 40 MG TABLET: 40 | 90 days supply | Qty: 90 | Fill #0

## 2016-10-06 MED FILL — METOPROLOL TARTRATE 25 MG T: 25 | 30 days supply | Qty: 30 | Fill #0

## 2016-10-12 MED FILL — LEVITRA 20 MG TABLET: 20 | 30 days supply | Qty: 6 | Fill #1

## 2016-10-19 MED FILL — CITALOPRAM HBR 10 MG TABLET: 10 | 90 days supply | Qty: 90 | Fill #1

## 2016-11-04 DIAGNOSIS — I10 Essential (primary) hypertension: Secondary | ICD-10-CM | POA: Diagnosis not present

## 2016-11-04 DIAGNOSIS — I25719 Atherosclerosis of autologous vein coronary artery bypass graft(s) with unspecified angina pectoris: Secondary | ICD-10-CM | POA: Diagnosis not present

## 2016-11-04 DIAGNOSIS — E782 Mixed hyperlipidemia: Secondary | ICD-10-CM | POA: Diagnosis not present

## 2016-11-05 MED FILL — METOPROLOL TARTRATE 25 MG T: 25 | 30 days supply | Qty: 30 | Fill #1

## 2016-12-03 DIAGNOSIS — I10 Essential (primary) hypertension: Secondary | ICD-10-CM | POA: Diagnosis not present

## 2016-12-03 DIAGNOSIS — E785 Hyperlipidemia, unspecified: Secondary | ICD-10-CM | POA: Diagnosis not present

## 2016-12-06 DIAGNOSIS — E785 Hyperlipidemia, unspecified: Secondary | ICD-10-CM | POA: Diagnosis not present

## 2016-12-06 DIAGNOSIS — I10 Essential (primary) hypertension: Secondary | ICD-10-CM | POA: Diagnosis not present

## 2016-12-06 DIAGNOSIS — Z23 Encounter for immunization: Secondary | ICD-10-CM | POA: Diagnosis not present

## 2016-12-06 DIAGNOSIS — Z125 Encounter for screening for malignant neoplasm of prostate: Secondary | ICD-10-CM | POA: Diagnosis not present

## 2016-12-07 MED FILL — SIMVASTATIN 40 MG TABLET: 40 | 90 days supply | Qty: 90 | Fill #1

## 2016-12-07 MED FILL — METOPROLOL TARTRATE 25 MG T: 25 | 30 days supply | Qty: 30 | Fill #2

## 2016-12-28 MED FILL — LEVITRA 20 MG TABLET: 20 | 30 days supply | Qty: 6 | Fill #2

## 2016-12-30 DIAGNOSIS — D179 Benign lipomatous neoplasm, unspecified: Secondary | ICD-10-CM | POA: Diagnosis not present

## 2016-12-30 DIAGNOSIS — L918 Other hypertrophic disorders of the skin: Secondary | ICD-10-CM | POA: Diagnosis not present

## 2016-12-30 DIAGNOSIS — Z1283 Encounter for screening for malignant neoplasm of skin: Secondary | ICD-10-CM | POA: Diagnosis not present

## 2016-12-30 DIAGNOSIS — D485 Neoplasm of uncertain behavior of skin: Secondary | ICD-10-CM | POA: Diagnosis not present

## 2016-12-30 DIAGNOSIS — D229 Melanocytic nevi, unspecified: Secondary | ICD-10-CM | POA: Diagnosis not present

## 2016-12-30 DIAGNOSIS — L812 Freckles: Secondary | ICD-10-CM | POA: Diagnosis not present

## 2016-12-30 DIAGNOSIS — Z85820 Personal history of malignant melanoma of skin: Secondary | ICD-10-CM | POA: Diagnosis not present

## 2016-12-30 DIAGNOSIS — L821 Other seborrheic keratosis: Secondary | ICD-10-CM | POA: Diagnosis not present

## 2016-12-30 DIAGNOSIS — L578 Other skin changes due to chronic exposure to nonionizing radiation: Secondary | ICD-10-CM | POA: Diagnosis not present

## 2017-01-05 MED FILL — METOPROLOL TARTRATE 25 MG T: 25 | 30 days supply | Qty: 30 | Fill #3

## 2017-01-27 MED FILL — OMEGA-3 ETHYL ESTERS 1 GM C: 1 | 90 days supply | Qty: 360 | Fill #1

## 2017-02-04 MED FILL — CITALOPRAM HBR 10 MG TABLET: 10 | 90 days supply | Qty: 90 | Fill #2

## 2017-02-04 MED FILL — METOPROLOL TARTRATE 25 MG T: 25 | 30 days supply | Qty: 30 | Fill #4

## 2017-03-04 MED FILL — METOPROLOL TARTRATE 25 MG T: 25 | 30 days supply | Qty: 30 | Fill #5

## 2017-03-04 MED FILL — SIMVASTATIN 40 MG TABLET: 40 | 90 days supply | Qty: 90 | Fill #2

## 2017-03-14 MED FILL — LEVITRA 20 MG TABLET: 20 | 30 days supply | Qty: 6 | Fill #3

## 2017-04-04 MED FILL — METOPROLOL TARTRATE 25 MG T: 25 | 30 days supply | Qty: 30 | Fill #0

## 2017-04-19 DIAGNOSIS — W57XXXA Bitten or stung by nonvenomous insect and other nonvenomous arthropods, initial encounter: Secondary | ICD-10-CM | POA: Diagnosis not present

## 2017-04-19 DIAGNOSIS — S30861A Insect bite (nonvenomous) of abdominal wall, initial encounter: Secondary | ICD-10-CM | POA: Diagnosis not present

## 2017-04-29 MED FILL — METOPROLOL TARTRATE 25 MG T: 25 | 90 days supply | Qty: 90 | Fill #1

## 2017-05-06 MED FILL — CITALOPRAM HBR 10 MG TABLET: 10 | 90 days supply | Qty: 90 | Fill #0

## 2017-05-09 DIAGNOSIS — E782 Mixed hyperlipidemia: Secondary | ICD-10-CM | POA: Diagnosis not present

## 2017-05-09 DIAGNOSIS — F419 Anxiety disorder, unspecified: Secondary | ICD-10-CM | POA: Diagnosis not present

## 2017-05-09 DIAGNOSIS — I25719 Atherosclerosis of autologous vein coronary artery bypass graft(s) with unspecified angina pectoris: Secondary | ICD-10-CM | POA: Diagnosis not present

## 2017-05-09 DIAGNOSIS — I10 Essential (primary) hypertension: Secondary | ICD-10-CM | POA: Diagnosis not present

## 2017-05-16 MED FILL — LEVITRA 20 MG TABLET: 20 | 30 days supply | Qty: 6 | Fill #4

## 2017-06-02 DIAGNOSIS — Z125 Encounter for screening for malignant neoplasm of prostate: Secondary | ICD-10-CM | POA: Diagnosis not present

## 2017-06-02 DIAGNOSIS — Z0000 Encounter for general adult medical examination without abnormal findings: Secondary | ICD-10-CM | POA: Diagnosis not present

## 2017-06-03 MED FILL — SIMVASTATIN 40 MG TABLET: 40 | 90 days supply | Qty: 90 | Fill #3

## 2017-06-07 ENCOUNTER — Other Ambulatory Visit: Payer: Self-pay | Admitting: Family Medicine

## 2017-06-07 DIAGNOSIS — Z0000 Encounter for general adult medical examination without abnormal findings: Secondary | ICD-10-CM

## 2017-06-07 DIAGNOSIS — I10 Essential (primary) hypertension: Secondary | ICD-10-CM | POA: Diagnosis not present

## 2017-06-07 DIAGNOSIS — F439 Reaction to severe stress, unspecified: Secondary | ICD-10-CM | POA: Diagnosis not present

## 2017-06-07 DIAGNOSIS — R1310 Dysphagia, unspecified: Secondary | ICD-10-CM

## 2017-06-07 DIAGNOSIS — E785 Hyperlipidemia, unspecified: Secondary | ICD-10-CM | POA: Diagnosis not present

## 2017-06-07 MED FILL — OMEPRAZOLE DR 40 MG CAPSULE: 40 | 30 days supply | Qty: 30 | Fill #0

## 2017-06-27 MED FILL — CITALOPRAM HBR 20 MG TABLET: 20 | 90 days supply | Qty: 90 | Fill #0

## 2017-06-27 MED FILL — OMEGA-3 ETHYL ESTERS 1 GM C: 1 | 90 days supply | Qty: 360 | Fill #2

## 2017-07-20 DIAGNOSIS — Z1283 Encounter for screening for malignant neoplasm of skin: Secondary | ICD-10-CM | POA: Diagnosis not present

## 2017-07-20 DIAGNOSIS — D1800 Hemangioma unspecified site: Secondary | ICD-10-CM | POA: Diagnosis not present

## 2017-07-20 DIAGNOSIS — D485 Neoplasm of uncertain behavior of skin: Secondary | ICD-10-CM | POA: Diagnosis not present

## 2017-07-20 DIAGNOSIS — L918 Other hypertrophic disorders of the skin: Secondary | ICD-10-CM | POA: Diagnosis not present

## 2017-07-20 DIAGNOSIS — L508 Other urticaria: Secondary | ICD-10-CM | POA: Diagnosis not present

## 2017-07-20 DIAGNOSIS — Z85820 Personal history of malignant melanoma of skin: Secondary | ICD-10-CM | POA: Diagnosis not present

## 2017-07-20 DIAGNOSIS — L821 Other seborrheic keratosis: Secondary | ICD-10-CM | POA: Diagnosis not present

## 2017-07-20 DIAGNOSIS — L578 Other skin changes due to chronic exposure to nonionizing radiation: Secondary | ICD-10-CM | POA: Diagnosis not present

## 2017-07-20 DIAGNOSIS — D179 Benign lipomatous neoplasm, unspecified: Secondary | ICD-10-CM | POA: Diagnosis not present

## 2017-07-20 MED FILL — TRIAMCINOLONE 0.1% CREAM: 0.1 | 10 days supply | Qty: 30 | Fill #0

## 2017-07-28 ENCOUNTER — Ambulatory Visit
Admission: RE | Admit: 2017-07-28 | Discharge: 2017-07-28 | Disposition: A | Discharge status: Home / Self Care | Payer: 59 | Source: Ambulatory Visit | Attending: Family Medicine | Admitting: Family Medicine

## 2017-07-28 DIAGNOSIS — K449 Diaphragmatic hernia without obstruction or gangrene: Secondary | ICD-10-CM | POA: Diagnosis not present

## 2017-07-28 DIAGNOSIS — K228 Other specified diseases of esophagus: Secondary | ICD-10-CM | POA: Insufficient documentation

## 2017-07-28 DIAGNOSIS — Z0000 Encounter for general adult medical examination without abnormal findings: Secondary | ICD-10-CM | POA: Insufficient documentation

## 2017-07-28 DIAGNOSIS — R1310 Dysphagia, unspecified: Secondary | ICD-10-CM | POA: Diagnosis not present

## 2017-07-29 MED FILL — LEVITRA 20 MG TABLET: 20 | 30 days supply | Qty: 6 | Fill #0

## 2017-08-03 MED FILL — METOPROLOL TARTRATE 25 MG T: 25 | 90 days supply | Qty: 90 | Fill #2

## 2017-08-26 MED FILL — SIMVASTATIN 40 MG TABLET: 40 | 90 days supply | Qty: 90 | Fill #0

## 2017-09-27 MED FILL — CITALOPRAM HBR 20 MG TABLET: 20 | 90 days supply | Qty: 90 | Fill #1

## 2017-10-19 MED FILL — VARDENAFIL HCL 20 MG TABS: 20 | 30 days supply | Qty: 6 | Fill #1

## 2017-11-02 MED FILL — METOPROLOL TARTRATE 25 MG T: 25 | 90 days supply | Qty: 90 | Fill #0

## 2017-11-14 ENCOUNTER — Other Ambulatory Visit: Payer: Self-pay | Admitting: Cardiology

## 2017-11-14 DIAGNOSIS — R079 Chest pain, unspecified: Secondary | ICD-10-CM

## 2017-11-17 MED FILL — traMADol HCL 50 MG TABS: 50 | 2 days supply | Qty: 15 | Fill #0

## 2017-11-17 MED FILL — CEPHALEXIN 500 MG CAPSULE: 500 | 7 days supply | Qty: 21 | Fill #0

## 2017-11-21 ENCOUNTER — Ambulatory Visit: Payer: 59

## 2017-11-30 DIAGNOSIS — I25719 Atherosclerosis of autologous vein coronary artery bypass graft(s) with unspecified angina pectoris: Secondary | ICD-10-CM | POA: Diagnosis not present

## 2017-11-30 DIAGNOSIS — R079 Chest pain, unspecified: Secondary | ICD-10-CM | POA: Diagnosis not present

## 2017-11-30 DIAGNOSIS — I10 Essential (primary) hypertension: Secondary | ICD-10-CM | POA: Diagnosis not present

## 2017-11-30 DIAGNOSIS — E782 Mixed hyperlipidemia: Secondary | ICD-10-CM | POA: Diagnosis not present

## 2017-12-01 DIAGNOSIS — E785 Hyperlipidemia, unspecified: Secondary | ICD-10-CM | POA: Diagnosis not present

## 2017-12-01 DIAGNOSIS — I10 Essential (primary) hypertension: Secondary | ICD-10-CM | POA: Diagnosis not present

## 2017-12-01 MED FILL — SIMVASTATIN 40 MG TABLET: 40 | 90 days supply | Qty: 90 | Fill #1

## 2017-12-01 MED FILL — OMEGA-3 ETHYL ESTERS 1 GM C: 1 | 30 days supply | Qty: 120 | Fill #0

## 2017-12-08 DIAGNOSIS — I10 Essential (primary) hypertension: Secondary | ICD-10-CM | POA: Diagnosis not present

## 2017-12-08 DIAGNOSIS — Z125 Encounter for screening for malignant neoplasm of prostate: Secondary | ICD-10-CM | POA: Diagnosis not present

## 2017-12-08 DIAGNOSIS — E785 Hyperlipidemia, unspecified: Secondary | ICD-10-CM | POA: Diagnosis not present

## 2018-01-02 MED FILL — CITALOPRAM HBR 20 MG TABLET: 20 | 90 days supply | Qty: 90 | Fill #2

## 2018-01-18 DIAGNOSIS — D225 Melanocytic nevi of trunk: Secondary | ICD-10-CM | POA: Diagnosis not present

## 2018-01-18 DIAGNOSIS — L820 Inflamed seborrheic keratosis: Secondary | ICD-10-CM | POA: Diagnosis not present

## 2018-01-18 DIAGNOSIS — Z85820 Personal history of malignant melanoma of skin: Secondary | ICD-10-CM | POA: Diagnosis not present

## 2018-01-18 DIAGNOSIS — D1800 Hemangioma unspecified site: Secondary | ICD-10-CM | POA: Diagnosis not present

## 2018-01-18 DIAGNOSIS — D485 Neoplasm of uncertain behavior of skin: Secondary | ICD-10-CM | POA: Diagnosis not present

## 2018-01-18 DIAGNOSIS — Z1283 Encounter for screening for malignant neoplasm of skin: Secondary | ICD-10-CM | POA: Diagnosis not present

## 2018-01-18 DIAGNOSIS — L812 Freckles: Secondary | ICD-10-CM | POA: Diagnosis not present

## 2018-01-18 DIAGNOSIS — L578 Other skin changes due to chronic exposure to nonionizing radiation: Secondary | ICD-10-CM | POA: Diagnosis not present

## 2018-01-18 DIAGNOSIS — L821 Other seborrheic keratosis: Secondary | ICD-10-CM | POA: Diagnosis not present

## 2018-01-26 MED FILL — OMEGA-3 ETHYL ESTERS 1 GM C: 1 | 30 days supply | Qty: 120 | Fill #1

## 2018-01-26 MED FILL — VARDENAFIL HCL 20 MG TABS: 20 | 30 days supply | Qty: 6 | Fill #2

## 2018-01-26 MED FILL — METOPROLOL TARTRATE 25 MG T: 25 | 90 days supply | Qty: 90 | Fill #1

## 2018-02-21 DIAGNOSIS — R21 Rash and other nonspecific skin eruption: Secondary | ICD-10-CM | POA: Diagnosis not present

## 2018-02-21 DIAGNOSIS — L237 Allergic contact dermatitis due to plants, except food: Secondary | ICD-10-CM | POA: Diagnosis not present

## 2018-02-21 DIAGNOSIS — L738 Other specified follicular disorders: Secondary | ICD-10-CM | POA: Diagnosis not present

## 2018-02-22 MED FILL — DOXYCYCLINE HYCLATE 100 MG: 100 | 21 days supply | Qty: 21 | Fill #0

## 2018-02-22 MED FILL — predniSONE 20 MG TABS: 20 | 19 days supply | Qty: 25 | Fill #0

## 2018-03-03 MED FILL — SIMVASTATIN 40 MG TABLET: 40 | 90 days supply | Qty: 90 | Fill #2

## 2018-03-20 MED FILL — OMEGA-3 ETHYL ESTERS 1 GM C: 1 | 30 days supply | Qty: 120 | Fill #2

## 2018-04-04 MED FILL — CITALOPRAM HBR 20 MG TABLET: 20 | 90 days supply | Qty: 90 | Fill #0

## 2018-04-18 MED FILL — OMEPRAZOLE DR 40 MG CAPSULE: 40 | 30 days supply | Qty: 30 | Fill #1

## 2018-04-18 MED FILL — METOPROLOL TARTRATE 25 MG T: 25 | 30 days supply | Qty: 30 | Fill #2

## 2018-04-19 DIAGNOSIS — W57XXXD Bitten or stung by nonvenomous insect and other nonvenomous arthropods, subsequent encounter: Secondary | ICD-10-CM | POA: Diagnosis not present

## 2018-04-19 DIAGNOSIS — F419 Anxiety disorder, unspecified: Secondary | ICD-10-CM | POA: Diagnosis not present

## 2018-04-19 DIAGNOSIS — R21 Rash and other nonspecific skin eruption: Secondary | ICD-10-CM | POA: Diagnosis not present

## 2018-04-19 DIAGNOSIS — R5383 Other fatigue: Secondary | ICD-10-CM | POA: Diagnosis not present

## 2018-04-19 DIAGNOSIS — W57XXXA Bitten or stung by nonvenomous insect and other nonvenomous arthropods, initial encounter: Secondary | ICD-10-CM | POA: Diagnosis not present

## 2018-05-03 DIAGNOSIS — L820 Inflamed seborrheic keratosis: Secondary | ICD-10-CM | POA: Diagnosis not present

## 2018-05-03 DIAGNOSIS — T07XXXA Unspecified multiple injuries, initial encounter: Secondary | ICD-10-CM | POA: Diagnosis not present

## 2018-05-03 DIAGNOSIS — D1801 Hemangioma of skin and subcutaneous tissue: Secondary | ICD-10-CM | POA: Diagnosis not present

## 2018-05-03 DIAGNOSIS — L821 Other seborrheic keratosis: Secondary | ICD-10-CM | POA: Diagnosis not present

## 2018-05-03 DIAGNOSIS — L508 Other urticaria: Secondary | ICD-10-CM | POA: Diagnosis not present

## 2018-05-12 MED FILL — OMEGA-3 ETHYL ESTERS 1 GM C: 1 | 30 days supply | Qty: 120 | Fill #3

## 2018-05-29 MED FILL — METOPROLOL TARTRATE 25 MG T: 25 | 30 days supply | Qty: 30 | Fill #0

## 2018-05-29 MED FILL — SIMVASTATIN 40 MG TABLET: 40 | 90 days supply | Qty: 90 | Fill #3

## 2018-05-29 MED FILL — VARDENAFIL HCL 20 MG TABS: 20 | 30 days supply | Qty: 6 | Fill #3

## 2018-06-07 DIAGNOSIS — L821 Other seborrheic keratosis: Secondary | ICD-10-CM | POA: Diagnosis not present

## 2018-06-07 DIAGNOSIS — T07XXXA Unspecified multiple injuries, initial encounter: Secondary | ICD-10-CM | POA: Diagnosis not present

## 2018-06-22 DIAGNOSIS — E785 Hyperlipidemia, unspecified: Secondary | ICD-10-CM | POA: Diagnosis not present

## 2018-06-22 DIAGNOSIS — Z125 Encounter for screening for malignant neoplasm of prostate: Secondary | ICD-10-CM | POA: Diagnosis not present

## 2018-06-28 DIAGNOSIS — Z0000 Encounter for general adult medical examination without abnormal findings: Secondary | ICD-10-CM | POA: Diagnosis not present

## 2018-06-28 DIAGNOSIS — E785 Hyperlipidemia, unspecified: Secondary | ICD-10-CM | POA: Diagnosis not present

## 2018-06-28 DIAGNOSIS — I25119 Atherosclerotic heart disease of native coronary artery with unspecified angina pectoris: Secondary | ICD-10-CM | POA: Diagnosis not present

## 2018-06-28 DIAGNOSIS — I10 Essential (primary) hypertension: Secondary | ICD-10-CM | POA: Diagnosis not present

## 2018-06-28 MED FILL — CITALOPRAM HBR 20 MG TABLET: 20 | 90 days supply | Qty: 90 | Fill #1

## 2018-06-28 MED FILL — OMEGA-3 ETHYL ESTER 1 GM CA: 1 | 30 days supply | Qty: 120 | Fill #4

## 2018-06-28 MED FILL — METOPROLOL TARTRATE 25 MG T: 25 | 30 days supply | Qty: 30 | Fill #1

## 2018-07-03 DIAGNOSIS — I25719 Atherosclerosis of autologous vein coronary artery bypass graft(s) with unspecified angina pectoris: Secondary | ICD-10-CM | POA: Diagnosis not present

## 2018-07-03 DIAGNOSIS — E782 Mixed hyperlipidemia: Secondary | ICD-10-CM | POA: Diagnosis not present

## 2018-07-03 DIAGNOSIS — I10 Essential (primary) hypertension: Secondary | ICD-10-CM | POA: Diagnosis not present

## 2018-07-27 DIAGNOSIS — D229 Melanocytic nevi, unspecified: Secondary | ICD-10-CM | POA: Diagnosis not present

## 2018-07-27 DIAGNOSIS — L812 Freckles: Secondary | ICD-10-CM | POA: Diagnosis not present

## 2018-07-27 DIAGNOSIS — L570 Actinic keratosis: Secondary | ICD-10-CM | POA: Diagnosis not present

## 2018-07-27 DIAGNOSIS — D225 Melanocytic nevi of trunk: Secondary | ICD-10-CM | POA: Diagnosis not present

## 2018-07-27 DIAGNOSIS — Z1283 Encounter for screening for malignant neoplasm of skin: Secondary | ICD-10-CM | POA: Diagnosis not present

## 2018-07-27 DIAGNOSIS — D485 Neoplasm of uncertain behavior of skin: Secondary | ICD-10-CM | POA: Diagnosis not present

## 2018-07-27 DIAGNOSIS — Z85820 Personal history of malignant melanoma of skin: Secondary | ICD-10-CM | POA: Diagnosis not present

## 2018-07-27 DIAGNOSIS — L578 Other skin changes due to chronic exposure to nonionizing radiation: Secondary | ICD-10-CM | POA: Diagnosis not present

## 2018-07-27 DIAGNOSIS — L821 Other seborrheic keratosis: Secondary | ICD-10-CM | POA: Diagnosis not present

## 2018-07-28 MED FILL — METOPROLOL TARTRATE 25 MG T: 25 | 30 days supply | Qty: 30 | Fill #2

## 2018-08-18 MED FILL — OMEGA-3 ETHYL ESTER 1 GM CA: 1 | 30 days supply | Qty: 120 | Fill #5

## 2018-08-29 MED FILL — METOPROLOL TARTRATE 25 MG T: 25 | 90 days supply | Qty: 90 | Fill #3

## 2018-08-29 MED FILL — SIMVASTATIN 40 MG TABLET: 40 | 90 days supply | Qty: 90 | Fill #0

## 2018-09-20 MED FILL — traMADol HCL 50 MG TABS: 50 | 2 days supply | Qty: 15 | Fill #0

## 2018-09-20 MED FILL — CLINDAMYCIN HCL 150 MG CAPS: 150 | 7 days supply | Qty: 21 | Fill #0

## 2018-10-03 MED FILL — VARDENAFIL HCL 20 MG TABS: 20 | 30 days supply | Qty: 6 | Fill #0

## 2018-10-04 DIAGNOSIS — I25719 Atherosclerosis of autologous vein coronary artery bypass graft(s) with unspecified angina pectoris: Secondary | ICD-10-CM | POA: Diagnosis not present

## 2018-10-09 MED FILL — OMEGA-3 ETHYL ESTER 1 GM CA: 1 | 30 days supply | Qty: 120 | Fill #6

## 2018-10-09 MED FILL — CITALOPRAM HBR 20 MG TABLET: 20 | 90 days supply | Qty: 90 | Fill #2

## 2018-11-17 DIAGNOSIS — R05 Cough: Secondary | ICD-10-CM | POA: Diagnosis not present

## 2018-11-17 DIAGNOSIS — J069 Acute upper respiratory infection, unspecified: Secondary | ICD-10-CM | POA: Diagnosis not present

## 2018-11-27 MED FILL — METOPROLOL TARTRATE 25 MG T: 25 | 30 days supply | Qty: 30 | Fill #4

## 2018-11-27 MED FILL — SIMVASTATIN 40 MG TABLET: 40 | 90 days supply | Qty: 90 | Fill #1

## 2018-12-06 MED FILL — OMEGA-3 ETHYL ESTER 1 GM CA: 1 | 30 days supply | Qty: 120 | Fill #0

## 2018-12-21 DIAGNOSIS — E785 Hyperlipidemia, unspecified: Secondary | ICD-10-CM | POA: Diagnosis not present

## 2018-12-26 DIAGNOSIS — E785 Hyperlipidemia, unspecified: Secondary | ICD-10-CM | POA: Diagnosis not present

## 2018-12-26 DIAGNOSIS — I25810 Atherosclerosis of coronary artery bypass graft(s) without angina pectoris: Secondary | ICD-10-CM | POA: Diagnosis not present

## 2018-12-26 DIAGNOSIS — F439 Reaction to severe stress, unspecified: Secondary | ICD-10-CM | POA: Diagnosis not present

## 2018-12-26 MED FILL — VARDENAFIL HCL 20 MG TABS: 20 | 30 days supply | Qty: 6 | Fill #1 | Status: TO

## 2018-12-26 MED FILL — METOPROLOL TARTRATE 25 MG T: 25 | 30 days supply | Qty: 30 | Fill #0

## 2018-12-27 DIAGNOSIS — F419 Anxiety disorder, unspecified: Secondary | ICD-10-CM | POA: Diagnosis not present

## 2018-12-27 DIAGNOSIS — E782 Mixed hyperlipidemia: Secondary | ICD-10-CM | POA: Diagnosis not present

## 2018-12-27 DIAGNOSIS — I25719 Atherosclerosis of autologous vein coronary artery bypass graft(s) with unspecified angina pectoris: Secondary | ICD-10-CM | POA: Diagnosis not present

## 2018-12-27 DIAGNOSIS — I10 Essential (primary) hypertension: Secondary | ICD-10-CM | POA: Diagnosis not present

## 2019-01-19 MED FILL — METOPROLOL TARTRATE 25 MG T: 25 | 30 days supply | Qty: 30 | Fill #1 | Status: TO

## 2019-01-19 MED FILL — CITALOPRAM HBR 20 MG TABLET: 20 | 90 days supply | Qty: 90 | Fill #0

## 2019-01-19 MED FILL — OMEGA-3 ETHYL ESTER 1 GM CA: 1 | 30 days supply | Qty: 120 | Fill #1 | Status: TO

## 2019-02-19 DIAGNOSIS — Z85820 Personal history of malignant melanoma of skin: Secondary | ICD-10-CM | POA: Diagnosis not present

## 2019-02-19 DIAGNOSIS — Z1283 Encounter for screening for malignant neoplasm of skin: Secondary | ICD-10-CM | POA: Diagnosis not present

## 2019-02-19 DIAGNOSIS — L821 Other seborrheic keratosis: Secondary | ICD-10-CM | POA: Diagnosis not present

## 2019-02-19 DIAGNOSIS — D225 Melanocytic nevi of trunk: Secondary | ICD-10-CM | POA: Diagnosis not present

## 2019-02-19 DIAGNOSIS — L578 Other skin changes due to chronic exposure to nonionizing radiation: Secondary | ICD-10-CM | POA: Diagnosis not present

## 2019-02-19 DIAGNOSIS — D485 Neoplasm of uncertain behavior of skin: Secondary | ICD-10-CM | POA: Diagnosis not present

## 2019-02-19 DIAGNOSIS — L820 Inflamed seborrheic keratosis: Secondary | ICD-10-CM | POA: Diagnosis not present

## 2019-02-19 DIAGNOSIS — L812 Freckles: Secondary | ICD-10-CM | POA: Diagnosis not present

## 2019-02-19 DIAGNOSIS — D1800 Hemangioma unspecified site: Secondary | ICD-10-CM | POA: Diagnosis not present

## 2019-02-20 MED FILL — METOPROLOL TARTRATE 25 MG T: 25 | 30 days supply | Qty: 30 | Fill #0

## 2019-02-20 MED FILL — VARDENAFIL HCL 20 MG TABS: 20 | 30 days supply | Qty: 6 | Fill #0

## 2019-02-20 MED FILL — OMEGA-3 ETHYL ESTERS 1 GM C: 1 | 30 days supply | Qty: 120 | Fill #0

## 2019-02-27 MED FILL — SIMVASTATIN 40 MG TABLET: 40 | 90 days supply | Qty: 90 | Fill #0

## 2019-03-26 MED FILL — METOPROLOL TARTRATE 25 MG T: 25 | 30 days supply | Qty: 30 | Fill #1

## 2019-04-26 MED FILL — METOPROLOL TARTRATE 25 MG T: 25 | 90 days supply | Qty: 90 | Fill #2

## 2019-04-26 MED FILL — CITALOPRAM HBR 20 MG TABLET: 20 | 90 days supply | Qty: 90 | Fill #0

## 2019-05-01 MED FILL — OMEGA-3 ETHYL ESTERS 1 GM C: 1 | 30 days supply | Qty: 120 | Fill #1

## 2019-05-08 MED FILL — VARDENAFIL HCL 20 MG TABS: 20 | 30 days supply | Qty: 6 | Fill #0

## 2019-05-28 MED FILL — SIMVASTATIN 40 MG TABLET: 40 | 90 days supply | Qty: 90 | Fill #0

## 2019-06-25 MED FILL — OMEGA-3 ETHYL ESTER 1 GM CA: 1 | 30 days supply | Qty: 120 | Fill #0

## 2019-06-25 MED FILL — VARDENAFIL HCL 20 MG TABS: 20 | 30 days supply | Qty: 6 | Fill #1

## 2019-07-09 DIAGNOSIS — I25719 Atherosclerosis of autologous vein coronary artery bypass graft(s) with unspecified angina pectoris: Secondary | ICD-10-CM | POA: Diagnosis not present

## 2019-07-09 DIAGNOSIS — I10 Essential (primary) hypertension: Secondary | ICD-10-CM | POA: Diagnosis not present

## 2019-07-09 DIAGNOSIS — E782 Mixed hyperlipidemia: Secondary | ICD-10-CM | POA: Diagnosis not present

## 2019-07-12 DIAGNOSIS — Z125 Encounter for screening for malignant neoplasm of prostate: Secondary | ICD-10-CM | POA: Diagnosis not present

## 2019-07-12 DIAGNOSIS — Z0000 Encounter for general adult medical examination without abnormal findings: Secondary | ICD-10-CM | POA: Diagnosis not present

## 2019-07-18 DIAGNOSIS — I10 Essential (primary) hypertension: Secondary | ICD-10-CM | POA: Diagnosis not present

## 2019-07-18 DIAGNOSIS — Z0000 Encounter for general adult medical examination without abnormal findings: Secondary | ICD-10-CM | POA: Diagnosis not present

## 2019-07-18 DIAGNOSIS — E785 Hyperlipidemia, unspecified: Secondary | ICD-10-CM | POA: Diagnosis not present

## 2019-07-18 DIAGNOSIS — K429 Umbilical hernia without obstruction or gangrene: Secondary | ICD-10-CM | POA: Diagnosis not present

## 2019-07-18 MED FILL — FINASTERIDE 1 MG TABLET: 1 | 90 days supply | Qty: 90 | Fill #0

## 2019-07-27 MED FILL — METOPROLOL TARTRATE 25 MG T: 25 | 30 days supply | Qty: 30 | Fill #0

## 2019-07-27 MED FILL — CITALOPRAM HBR 20 MG TABLET: 20 | 90 days supply | Qty: 90 | Fill #0

## 2019-08-16 MED FILL — OMEGA-3 ETHYL ESTER 1 GM CA: 1 | 30 days supply | Qty: 120 | Fill #1

## 2019-08-22 DIAGNOSIS — Z1283 Encounter for screening for malignant neoplasm of skin: Secondary | ICD-10-CM | POA: Diagnosis not present

## 2019-08-22 DIAGNOSIS — L578 Other skin changes due to chronic exposure to nonionizing radiation: Secondary | ICD-10-CM | POA: Diagnosis not present

## 2019-08-22 DIAGNOSIS — Z86018 Personal history of other benign neoplasm: Secondary | ICD-10-CM | POA: Diagnosis not present

## 2019-08-22 DIAGNOSIS — Z872 Personal history of diseases of the skin and subcutaneous tissue: Secondary | ICD-10-CM | POA: Diagnosis not present

## 2019-08-22 DIAGNOSIS — Z85820 Personal history of malignant melanoma of skin: Secondary | ICD-10-CM | POA: Diagnosis not present

## 2019-08-22 DIAGNOSIS — L814 Other melanin hyperpigmentation: Secondary | ICD-10-CM | POA: Diagnosis not present

## 2019-08-22 DIAGNOSIS — D1800 Hemangioma unspecified site: Secondary | ICD-10-CM | POA: Diagnosis not present

## 2019-08-22 DIAGNOSIS — L821 Other seborrheic keratosis: Secondary | ICD-10-CM | POA: Diagnosis not present

## 2019-08-28 MED FILL — METOPROLOL TARTRATE 25 MG T: 25 | 30 days supply | Qty: 30 | Fill #1

## 2019-08-28 MED FILL — SIMVASTATIN 40 MG TABLET: 40 | 90 days supply | Qty: 90 | Fill #1

## 2019-09-05 MED FILL — VARDENAFIL HCL 20 MG TABS: 20 | 30 days supply | Qty: 6 | Fill #2

## 2019-09-26 MED FILL — METOPROLOL TARTRATE 25 MG T: 25 | 30 days supply | Qty: 30 | Fill #2

## 2019-10-15 MED FILL — OMEGA-3 ETHYL ESTER 1 GM CA: 1 | 30 days supply | Qty: 120 | Fill #2

## 2019-10-17 DIAGNOSIS — Z125 Encounter for screening for malignant neoplasm of prostate: Secondary | ICD-10-CM | POA: Diagnosis not present

## 2019-10-18 MED FILL — OMEPRAZOLE 20 MG CAPSULE DR: 20 | 30 days supply | Qty: 30 | Fill #0

## 2019-10-19 MED FILL — FINASTERIDE 1 MG TABLET: 1 | 90 days supply | Qty: 90 | Fill #1

## 2019-10-26 MED FILL — CITALOPRAM HBR 20 MG TABLET: 20 | 90 days supply | Qty: 90 | Fill #0

## 2019-10-26 MED FILL — METOPROLOL TARTRATE 25 MG T: 25 | 30 days supply | Qty: 30 | Fill #3

## 2019-11-15 DIAGNOSIS — J301 Allergic rhinitis due to pollen: Secondary | ICD-10-CM | POA: Diagnosis not present

## 2019-11-15 DIAGNOSIS — R1314 Dysphagia, pharyngoesophageal phase: Secondary | ICD-10-CM | POA: Diagnosis not present

## 2019-11-15 DIAGNOSIS — K219 Gastro-esophageal reflux disease without esophagitis: Secondary | ICD-10-CM | POA: Diagnosis not present

## 2019-11-15 MED FILL — FLUTICASONE PROP 50 MCG SPR: 50 | 30 days supply | Qty: 16 | Fill #0

## 2019-11-20 MED FILL — OMEPRAZOLE 20 MG CAPSULE DR: 20 | 30 days supply | Qty: 30 | Fill #1

## 2019-11-26 MED FILL — VARDENAFIL HCL 20 MG TABS: 20 | 30 days supply | Qty: 6 | Fill #0

## 2019-11-26 MED FILL — METOPROLOL TARTRATE 25 MG T: 25 | 30 days supply | Qty: 30 | Fill #4

## 2019-11-29 MED FILL — SIMVASTATIN 40 MG TABLET: 40 | 90 days supply | Qty: 90 | Fill #2

## 2019-12-12 DIAGNOSIS — Z01818 Encounter for other preprocedural examination: Secondary | ICD-10-CM | POA: Diagnosis not present

## 2019-12-12 DIAGNOSIS — Z1211 Encounter for screening for malignant neoplasm of colon: Secondary | ICD-10-CM | POA: Diagnosis not present

## 2019-12-12 DIAGNOSIS — I25719 Atherosclerosis of autologous vein coronary artery bypass graft(s) with unspecified angina pectoris: Secondary | ICD-10-CM | POA: Diagnosis not present

## 2019-12-12 DIAGNOSIS — K219 Gastro-esophageal reflux disease without esophagitis: Secondary | ICD-10-CM | POA: Diagnosis not present

## 2019-12-18 MED FILL — OMEPRAZOLE DR 20 MG CAPSULE: 20 | 30 days supply | Qty: 30 | Fill #2

## 2019-12-18 MED FILL — OMEGA-3 ETHYL ESTERS 1 GM C: 1 | 30 days supply | Qty: 120 | Fill #0

## 2019-12-25 MED FILL — METOPROLOL TARTRATE 25 MG T: 25 | 30 days supply | Qty: 30 | Fill #0

## 2020-01-07 DIAGNOSIS — I10 Essential (primary) hypertension: Secondary | ICD-10-CM | POA: Diagnosis not present

## 2020-01-07 DIAGNOSIS — E782 Mixed hyperlipidemia: Secondary | ICD-10-CM | POA: Diagnosis not present

## 2020-01-07 DIAGNOSIS — I25719 Atherosclerosis of autologous vein coronary artery bypass graft(s) with unspecified angina pectoris: Secondary | ICD-10-CM | POA: Diagnosis not present

## 2020-01-11 MED FILL — FINASTERIDE 1 MG TABLET: 1 | 90 days supply | Qty: 90 | Fill #2

## 2020-01-15 DIAGNOSIS — Z01818 Encounter for other preprocedural examination: Secondary | ICD-10-CM | POA: Diagnosis not present

## 2020-01-15 DIAGNOSIS — L659 Nonscarring hair loss, unspecified: Secondary | ICD-10-CM | POA: Diagnosis not present

## 2020-01-15 DIAGNOSIS — E782 Mixed hyperlipidemia: Secondary | ICD-10-CM | POA: Diagnosis not present

## 2020-01-15 DIAGNOSIS — I10 Essential (primary) hypertension: Secondary | ICD-10-CM | POA: Diagnosis not present

## 2020-01-15 DIAGNOSIS — R1310 Dysphagia, unspecified: Secondary | ICD-10-CM | POA: Diagnosis not present

## 2020-01-17 MED FILL — OMEPRAZOLE DR 20 MG CAPSULE: 20 | 30 days supply | Qty: 30 | Fill #3

## 2020-01-18 DIAGNOSIS — K219 Gastro-esophageal reflux disease without esophagitis: Secondary | ICD-10-CM | POA: Diagnosis not present

## 2020-01-18 DIAGNOSIS — K640 First degree hemorrhoids: Secondary | ICD-10-CM | POA: Diagnosis not present

## 2020-01-18 DIAGNOSIS — Z1211 Encounter for screening for malignant neoplasm of colon: Secondary | ICD-10-CM | POA: Diagnosis not present

## 2020-01-18 DIAGNOSIS — K2970 Gastritis, unspecified, without bleeding: Secondary | ICD-10-CM | POA: Diagnosis not present

## 2020-01-24 MED FILL — METOPROLOL TARTRATE 25 MG T: 25 | 30 days supply | Qty: 30 | Fill #1

## 2020-01-29 MED FILL — CITALOPRAM HBR 20 MG TABLET: 20 | 90 days supply | Qty: 90 | Fill #0

## 2020-01-29 MED FILL — AMOXICILLIN 875 MG TABS: 875 | 7 days supply | Qty: 14 | Fill #0

## 2020-02-12 MED FILL — OMEGA-3 ETHYL ESTERS 1 GM C: 1 | 30 days supply | Qty: 120 | Fill #1

## 2020-02-12 MED FILL — VARDENAFIL HCL 20 MG TABS: 20 | 30 days supply | Qty: 6 | Fill #1

## 2020-02-15 DIAGNOSIS — C801 Malignant (primary) neoplasm, unspecified: Secondary | ICD-10-CM

## 2020-02-21 ENCOUNTER — Ambulatory Visit: Payer: 59 | Admitting: Dermatology

## 2020-02-21 ENCOUNTER — Other Ambulatory Visit: Payer: Self-pay

## 2020-02-21 DIAGNOSIS — L814 Other melanin hyperpigmentation: Secondary | ICD-10-CM | POA: Diagnosis not present

## 2020-02-21 DIAGNOSIS — L821 Other seborrheic keratosis: Secondary | ICD-10-CM

## 2020-02-21 DIAGNOSIS — L820 Inflamed seborrheic keratosis: Secondary | ICD-10-CM

## 2020-02-21 DIAGNOSIS — L578 Other skin changes due to chronic exposure to nonionizing radiation: Secondary | ICD-10-CM | POA: Diagnosis not present

## 2020-02-21 DIAGNOSIS — Z85820 Personal history of malignant melanoma of skin: Secondary | ICD-10-CM | POA: Diagnosis not present

## 2020-02-21 DIAGNOSIS — D1800 Hemangioma unspecified site: Secondary | ICD-10-CM

## 2020-02-21 DIAGNOSIS — D229 Melanocytic nevi, unspecified: Secondary | ICD-10-CM

## 2020-02-21 DIAGNOSIS — Z1283 Encounter for screening for malignant neoplasm of skin: Secondary | ICD-10-CM

## 2020-02-21 NOTE — Progress Notes (Signed)
   Follow-Up Visit   Subjective  Carlos Blair is a 64 y.o. male who presents for the following: Annual Exam (6 months f/u hx of MM L knee 2010). The patient presents for total body skin exam for history of skin cancer and skin cancer screening. He has several spots he would like checked some are symptomatic.   The following portions of the chart were reviewed this encounter and updated as appropriate: Allergies  Meds  Problems  Med Hx  Surg Hx  Fam Hx      Review of Systems: No other skin or systemic complaints.  Objective  Well appearing patient in no apparent distress; mood and affect are within normal limits.  A full examination was performed including scalp, head, eyes, ears, nose, lips, neck, chest, axillae, abdomen, back, buttocks, bilateral upper extremities, bilateral lower extremities, hands, feet, fingers, toes, fingernails, and toenails. All findings within normal limits unless otherwise noted below.  Objective  Left lat knee: Well healed scar with no evidence of recurrence, no lymphadenopathy.   Objective  Face (18), Left Axilla (3), Left Thigh - Anterior, Left chest, Right Axilla, Right Popliteal Fossa, Right Upper Back: Erythematous keratotic or waxy stuck-on papule or plaque.   Assessment & Plan   Skin cancer screening performed today.  History of Melanoma - No evidence of recurrence today - No lymphadenopathy - Recommend regular full body skin exams - Recommend daily broad spectrum sunscreen SPF 30+ to sun-exposed areas, reapply every 2 hours as needed.  - Call if any new or changing lesions are noted between office visits  Melanocytic Nevi - Tan-brown and/or pink-flesh-colored symmetric macules and papules - Benign appearing on exam today - Observation - Call clinic for new or changing moles - Recommend daily use of broad spectrum spf 30+ sunscreen to sun-exposed areas.   Seborrheic Keratoses - Stuck-on, waxy, tan-brown papules and plaques  -  Discussed benign etiology and prognosis. - Observe - Call for any changes  Actinic Damage - diffuse scaly erythematous macules with underlying dyspigmentation - Recommend daily broad spectrum sunscreen SPF 30+ to sun-exposed areas, reapply every 2 hours as needed.  - Call for new or changing lesions.  Lentigines - Scattered tan macules - Discussed due to sun exposure - Benign, observe - Call for any changes  Hemangiomas - Red papules - Discussed benign nature - Observe - Call for any changes   History of melanoma Left lat knee  Observe   Inflamed seborrheic keratosis (26) Face (18); Right Popliteal Fossa; Left chest; Left Thigh - Anterior; Left Axilla (3); Right Axilla; Right Upper Back  Destruction of lesion - Face Complexity: simple   Destruction method: cryotherapy   Informed consent: discussed and consent obtained   Timeout:  patient name, date of birth, surgical site, and procedure verified Lesion destroyed using liquid nitrogen: Yes   Region frozen until ice ball extended beyond lesion: Yes   Outcome: patient tolerated procedure well with no complications   Post-procedure details: wound care instructions given    Return in about 6 months (around 08/22/2020) for TBSE.   I, Ashok Cordia, CMA, am acting as scribe for Sarina Ser, MD .

## 2020-02-21 NOTE — Patient Instructions (Signed)

## 2020-02-22 ENCOUNTER — Encounter: Payer: Self-pay | Admitting: Dermatology

## 2020-02-22 MED FILL — OMEPRAZOLE DR 20 MG CAPSULE: 20 | 30 days supply | Qty: 30 | Fill #4

## 2020-02-22 MED FILL — METOPROLOL TARTRATE 25 MG T: 25 | 30 days supply | Qty: 30 | Fill #2

## 2020-02-29 ENCOUNTER — Other Ambulatory Visit (HOSPITAL_COMMUNITY): Payer: Self-pay | Admitting: Family Medicine

## 2020-02-29 MED FILL — SIMVASTATIN 40 MG TABLET: 40 | 90 days supply | Qty: 90 | Fill #0

## 2020-04-04 MED FILL — OMEGA-3 ETHYL ESTERS 1 GM C: 1 | 30 days supply | Qty: 120 | Fill #2

## 2020-04-08 ENCOUNTER — Ambulatory Visit: Payer: 59 | Admitting: Dermatology

## 2020-04-08 ENCOUNTER — Other Ambulatory Visit: Payer: Self-pay

## 2020-04-08 ENCOUNTER — Encounter: Payer: Self-pay | Admitting: Dermatology

## 2020-04-08 DIAGNOSIS — L820 Inflamed seborrheic keratosis: Secondary | ICD-10-CM | POA: Diagnosis not present

## 2020-04-08 DIAGNOSIS — L237 Allergic contact dermatitis due to plants, except food: Secondary | ICD-10-CM

## 2020-04-08 DIAGNOSIS — B353 Tinea pedis: Secondary | ICD-10-CM

## 2020-04-08 DIAGNOSIS — L239 Allergic contact dermatitis, unspecified cause: Secondary | ICD-10-CM

## 2020-04-08 MED ORDER — TRIAMCINOLONE ACETONIDE 0.1 % EX CREA: 1.0000 "application " | TOPICAL_CREAM | Freq: Every day | CUTANEOUS | 1 refills | Status: AC

## 2020-04-08 MED ORDER — KETOCONAZOLE 2 % EX CREA: 1.0000 "application " | TOPICAL_CREAM | Freq: Every day | CUTANEOUS | 2 refills | Status: AC

## 2020-04-08 MED FILL — KETOCONAZOLE 2% CREAM: 2 | 30 days supply | Qty: 60 | Fill #0

## 2020-04-08 MED FILL — TRIAMCINOLONE 0.1% CREAM: 0.1 | 5 days supply | Qty: 30 | Fill #0

## 2020-04-08 NOTE — Progress Notes (Signed)
   Follow-Up Visit   Subjective  Carlos Blair is a 64 y.o. male who presents for the following: poison ivy/oak? (legs, ~10 days, itchy, calamine lotion, benadryl ), Rash (neck, comes and goes ~ 10 days, itchy), and Rash (bil feet ~51m, used otc lotrimine). He also has keratoses on his neck that are irritating would like treated.   The following portions of the chart were reviewed this encounter and updated as appropriate:  Allergies  Meds  Problems  Med Hx  Surg Hx  Fam Hx      Review of Systems:  No other skin or systemic complaints except as noted in HPI or Assessment and Plan.  Objective  Well appearing patient in no apparent distress; mood and affect are within normal limits.  A focused examination was performed including neck, chest, bil legs and feet. Relevant physical exam findings are noted in the Assessment and Plan.  Objective  R lower leg: Pink patches R lower leg  Objective  neck/shoulders x 23, face x 4 (27): Erythematous keratotic or waxy stuck-on papule or plaque.   Objective  bil feet: Scaliness feet   Assessment & Plan  Allergic contact dermatitis, unspecified trigger R lower leg  2ndary to Poison Ivy/Oak  Start TMC 0.1% cr qd until clear, then prn flares, avoid f/g/a  triamcinolone cream (KENALOG) 0.1 % - R lower leg  Inflamed seborrheic keratosis (27) neck/shoulders x 23, face x 4  Destruction of lesion - neck/shoulders x 23, face x 4 Complexity: simple   Destruction method: cryotherapy   Informed consent: discussed and consent obtained   Timeout:  patient name, date of birth, surgical site, and procedure verified Lesion destroyed using liquid nitrogen: Yes   Region frozen until ice ball extended beyond lesion: Yes   Outcome: patient tolerated procedure well with no complications   Post-procedure details: wound care instructions given    Tinea pedis of both feet bil feet  Vs less likely ACD  Start Ketoconazole 2% cr qhs for  82month  ketoconazole (NIZORAL) 2 % cream - bil feet  Return for as scheduled 08/27/20 at 10:30am, 37m f/u TBSE.   I, Othelia Pulling, RMA, am acting as scribe for Sarina Ser, MD .  Documentation: I have reviewed the above documentation for accuracy and completeness, and I agree with the above.  Sarina Ser, MD

## 2020-04-09 ENCOUNTER — Encounter: Payer: Self-pay | Admitting: Dermatology

## 2020-04-10 ENCOUNTER — Encounter: Payer: Self-pay | Admitting: Dermatology

## 2020-04-29 MED FILL — CITALOPRAM HBR 20 MG TABLET: 20 | 90 days supply | Qty: 90 | Fill #0

## 2020-05-12 MED FILL — VARDENAFIL HCL 20 MG TABS: 20 | 30 days supply | Qty: 6 | Fill #2

## 2020-05-23 MED FILL — OMEPRAZOLE DR 20 MG CAPSULE: 20 | 30 days supply | Qty: 30 | Fill #7

## 2020-05-23 MED FILL — METOPROLOL TARTRATE 25 MG T: 25 | 30 days supply | Qty: 30 | Fill #0

## 2020-05-23 MED FILL — OMEGA-3 ETHYL ESTERS 1 GM C: 1 | 30 days supply | Qty: 120 | Fill #3

## 2020-05-30 MED FILL — SIMVASTATIN 40 MG TABLET: 40 | 90 days supply | Qty: 90 | Fill #1

## 2020-06-23 MED FILL — OMEPRAZOLE DR 20 MG CAPSULE: 20 | 30 days supply | Qty: 30 | Fill #8

## 2020-06-23 MED FILL — METOPROLOL TARTRATE 25 MG T: 25 | 30 days supply | Qty: 30 | Fill #1

## 2020-07-09 DIAGNOSIS — Z125 Encounter for screening for malignant neoplasm of prostate: Secondary | ICD-10-CM | POA: Diagnosis not present

## 2020-07-09 DIAGNOSIS — Z1389 Encounter for screening for other disorder: Secondary | ICD-10-CM | POA: Diagnosis not present

## 2020-07-09 DIAGNOSIS — Z0000 Encounter for general adult medical examination without abnormal findings: Secondary | ICD-10-CM | POA: Diagnosis not present

## 2020-07-09 DIAGNOSIS — Z13220 Encounter for screening for lipoid disorders: Secondary | ICD-10-CM | POA: Diagnosis not present

## 2020-07-16 DIAGNOSIS — I25719 Atherosclerosis of autologous vein coronary artery bypass graft(s) with unspecified angina pectoris: Secondary | ICD-10-CM | POA: Diagnosis not present

## 2020-07-16 DIAGNOSIS — E782 Mixed hyperlipidemia: Secondary | ICD-10-CM | POA: Diagnosis not present

## 2020-07-16 DIAGNOSIS — I10 Essential (primary) hypertension: Secondary | ICD-10-CM | POA: Diagnosis not present

## 2020-07-18 MED FILL — OMEGA-3 ETHYL ESTERS 1 GM C: 1 | 30 days supply | Qty: 120 | Fill #4

## 2020-07-23 MED FILL — VARDENAFIL HCL 20 MG TABS: 20 | 30 days supply | Qty: 6 | Fill #3

## 2020-07-23 MED FILL — METOPROLOL TARTRATE 25 MG T: 25 | 30 days supply | Qty: 30 | Fill #2

## 2020-07-29 MED FILL — CITALOPRAM HBR 20 MG TABLET: 20 | 90 days supply | Qty: 90 | Fill #1

## 2020-07-29 MED FILL — OMEPRAZOLE DR 20 MG CAPSULE: 20 | 30 days supply | Qty: 30 | Fill #9

## 2020-08-08 ENCOUNTER — Other Ambulatory Visit (HOSPITAL_COMMUNITY): Payer: Self-pay | Admitting: Family Medicine

## 2020-08-08 MED FILL — FINASTERIDE 1 MG TABLET: 1 | 90 days supply | Qty: 90 | Fill #0

## 2020-08-21 MED FILL — METOPROLOL TARTRATE 25 MG T: 25 | 30 days supply | Qty: 30 | Fill #3

## 2020-08-22 MED FILL — OMEPRAZOLE DR 20 MG CAPSULE: 20 | 30 days supply | Qty: 30 | Fill #10

## 2020-08-22 MED FILL — SIMVASTATIN 40 MG TABLET: 40 | 90 days supply | Qty: 90 | Fill #2

## 2020-08-27 ENCOUNTER — Ambulatory Visit: Payer: 59 | Admitting: Dermatology

## 2020-09-08 MED FILL — OMEGA-3 ETHYL ESTERS 1 GM C: 1 | 30 days supply | Qty: 120 | Fill #5

## 2020-09-15 MED FILL — METOPROLOL TARTRATE 25 MG T: 25 | 30 days supply | Qty: 30 | Fill #4

## 2020-09-29 ENCOUNTER — Ambulatory Visit: Payer: 59 | Admitting: Dermatology

## 2020-09-29 ENCOUNTER — Other Ambulatory Visit: Payer: Self-pay

## 2020-09-29 DIAGNOSIS — L814 Other melanin hyperpigmentation: Secondary | ICD-10-CM | POA: Diagnosis not present

## 2020-09-29 DIAGNOSIS — L821 Other seborrheic keratosis: Secondary | ICD-10-CM

## 2020-09-29 DIAGNOSIS — Z1283 Encounter for screening for malignant neoplasm of skin: Secondary | ICD-10-CM | POA: Diagnosis not present

## 2020-09-29 DIAGNOSIS — D229 Melanocytic nevi, unspecified: Secondary | ICD-10-CM | POA: Diagnosis not present

## 2020-09-29 DIAGNOSIS — D1800 Hemangioma unspecified site: Secondary | ICD-10-CM | POA: Diagnosis not present

## 2020-09-29 DIAGNOSIS — Z85820 Personal history of malignant melanoma of skin: Secondary | ICD-10-CM | POA: Diagnosis not present

## 2020-09-29 DIAGNOSIS — L578 Other skin changes due to chronic exposure to nonionizing radiation: Secondary | ICD-10-CM

## 2020-09-29 DIAGNOSIS — L820 Inflamed seborrheic keratosis: Secondary | ICD-10-CM | POA: Diagnosis not present

## 2020-09-29 NOTE — Progress Notes (Signed)
   Follow-Up Visit   Subjective  Carlos Blair is a 64 y.o. male who presents for the following: Annual Exam (6 months f/u TBSE, Hx of Melanoma L knee in 2005). Pt report new tag moles on his neck irritated when shaving.  The patient presents for Total-Body Skin Exam (TBSE) for skin cancer screening and mole check.  The following portions of the chart were reviewed this encounter and updated as appropriate:  Allergies  Meds  Problems  Med Hx  Surg Hx  Fam Hx     Review of Systems:  No other skin or systemic complaints except as noted in HPI or Assessment and Plan.  Objective  Well appearing patient in no apparent distress; mood and affect are within normal limits.  A full examination was performed including scalp, head, eyes, ears, nose, lips, neck, chest, axillae, abdomen, back, buttocks, bilateral upper extremities, bilateral lower extremities, hands, feet, fingers, toes, fingernails, and toenails. All findings within normal limits unless otherwise noted below.  Objective  Left Knee - Anterior: Well healed scar with no evidence of recurrence, no lymphadenopathy.   Objective  Neck - Anterior, L groin (23): Erythematous keratotic or waxy stuck-on papule or plaque.    Assessment & Plan  History of melanoma Left Knee - Anterior  Clear. No Lymphadenopathy. Observe for recurrence. Call clinic for new or changing lesions.  Recommend regular skin exams, daily broad-spectrum spf 30+ sunscreen use, and photoprotection.     Inflamed seborrheic keratosis (23) Neck -, L groin  Destruction of lesion - Neck - Anterior, L groin Complexity: simple   Destruction method: cryotherapy   Informed consent: discussed and consent obtained   Timeout:  patient name, date of birth, surgical site, and procedure verified Lesion destroyed using liquid nitrogen: Yes   Region frozen until ice ball extended beyond lesion: Yes   Outcome: patient tolerated procedure well with no complications     Post-procedure details: wound care instructions given     Lentigines - Scattered tan macules - Discussed due to sun exposure - Benign, observe - Call for any changes  Seborrheic Keratoses - Stuck-on, waxy, tan-brown papules and plaques  - Discussed benign etiology and prognosis. - Observe - Call for any changes  Melanocytic Nevi - Tan-brown and/or pink-flesh-colored symmetric macules and papules - Benign appearing on exam today - Observation - Call clinic for new or changing moles - Recommend daily use of broad spectrum spf 30+ sunscreen to sun-exposed areas.   Hemangiomas - Red papules - Discussed benign nature - Observe - Call for any changes  Actinic Damage - Chronic, secondary to cumulative UV/sun exposure - diffuse scaly erythematous macules with underlying dyspigmentation - Recommend daily broad spectrum sunscreen SPF 30+ to sun-exposed areas, reapply every 2 hours as needed.  - Call for new or changing lesions.  Skin cancer screening performed today.  Return in about 6 months (around 03/29/2021) for TBSE Hx of Melanoma .  IMarye Round, CMA, am acting as scribe for Sarina Ser, MD .  Documentation: I have reviewed the above documentation for accuracy and completeness, and I agree with the above.  Sarina Ser, MD

## 2020-09-29 NOTE — Patient Instructions (Signed)
Cryotherapy Aftercare  . Wash gently with soap and water everyday.   . Apply Vaseline and Band-Aid daily until healed.  

## 2020-09-30 MED FILL — OMEPRAZOLE DR 20 MG CAPSULE: 20 | 30 days supply | Qty: 30 | Fill #11

## 2020-10-01 ENCOUNTER — Encounter: Payer: Self-pay | Admitting: Dermatology

## 2020-10-21 ENCOUNTER — Other Ambulatory Visit (HOSPITAL_COMMUNITY): Payer: Self-pay | Admitting: Family Medicine

## 2020-10-21 MED FILL — CITALOPRAM HBR 20 MG TABLET: 20 | 90 days supply | Qty: 90 | Fill #2

## 2020-10-21 MED FILL — VARDENAFIL HCL 20 MG TABS: 20 | 30 days supply | Qty: 6 | Fill #4

## 2020-10-21 MED FILL — METOPROLOL TARTRATE 25 MG T: 25 | 90 days supply | Qty: 90 | Fill #0

## 2020-10-24 MED FILL — OMEPRAZOLE DR 20 MG CAPSULE: 20 | 30 days supply | Qty: 30 | Fill #0

## 2020-10-27 MED FILL — OMEGA-3 ETHYL ESTERS 1 GM C: 1 | 30 days supply | Qty: 120 | Fill #6

## 2020-11-28 MED FILL — FINASTERIDE 1 MG TABLET: 1 | 90 days supply | Qty: 90 | Fill #1

## 2020-11-28 MED FILL — SIMVASTATIN 40 MG TABLET: 40 | 90 days supply | Qty: 90 | Fill #3

## 2020-11-28 MED FILL — OMEPRAZOLE DR 20 MG CAPSULE: 20 | 30 days supply | Qty: 30 | Fill #1

## 2020-12-18 DIAGNOSIS — E782 Mixed hyperlipidemia: Secondary | ICD-10-CM | POA: Diagnosis not present

## 2020-12-18 DIAGNOSIS — Z0000 Encounter for general adult medical examination without abnormal findings: Secondary | ICD-10-CM | POA: Diagnosis not present

## 2020-12-18 DIAGNOSIS — D649 Anemia, unspecified: Secondary | ICD-10-CM | POA: Diagnosis not present

## 2020-12-18 DIAGNOSIS — F419 Anxiety disorder, unspecified: Secondary | ICD-10-CM | POA: Diagnosis not present

## 2020-12-18 DIAGNOSIS — I10 Essential (primary) hypertension: Secondary | ICD-10-CM | POA: Diagnosis not present

## 2020-12-22 ENCOUNTER — Other Ambulatory Visit (HOSPITAL_COMMUNITY): Payer: Self-pay | Admitting: Family Medicine

## 2020-12-22 MED FILL — OMEGA-3 ETHYL ESTERS 1 GM C: 1 | 30 days supply | Qty: 120 | Fill #0

## 2020-12-29 MED FILL — OMEPRAZOLE DR 20 MG CAPSULE: 20 | 30 days supply | Qty: 30 | Fill #2

## 2021-01-12 ENCOUNTER — Other Ambulatory Visit (HOSPITAL_COMMUNITY): Payer: Self-pay | Admitting: Rehabilitative and Restorative Service Providers"

## 2021-01-12 DIAGNOSIS — I10 Essential (primary) hypertension: Secondary | ICD-10-CM | POA: Diagnosis not present

## 2021-01-12 DIAGNOSIS — E782 Mixed hyperlipidemia: Secondary | ICD-10-CM | POA: Diagnosis not present

## 2021-01-12 DIAGNOSIS — I25719 Atherosclerosis of autologous vein coronary artery bypass graft(s) with unspecified angina pectoris: Secondary | ICD-10-CM | POA: Diagnosis not present

## 2021-01-12 MED FILL — ROSUVASTATIN CALCIUM 20 MG: 20 | 30 days supply | Qty: 30 | Fill #0

## 2021-01-19 MED FILL — METOPROLOL TARTRATE 25 MG T: 25 | 60 days supply | Qty: 60 | Fill #1

## 2021-01-26 ENCOUNTER — Other Ambulatory Visit (HOSPITAL_COMMUNITY): Payer: Self-pay | Admitting: Cardiology

## 2021-02-02 MED FILL — OMEPRAZOLE DR 20 MG CAPSULE: 20 | 30 days supply | Qty: 30 | Fill #3

## 2021-02-09 MED FILL — OMEGA-3-ACID ETHYL ESTERS 1: 1 | 30 days supply | Qty: 120 | Fill #1

## 2021-02-13 ENCOUNTER — Other Ambulatory Visit (HOSPITAL_COMMUNITY): Payer: Self-pay | Admitting: Family Medicine

## 2021-02-13 MED FILL — VARDENAFIL HCL 20 MG TABS: 20 | 30 days supply | Qty: 6 | Fill #0

## 2021-02-13 MED FILL — ROSUVASTATIN CALCIUM 20 MG: 20 | 30 days supply | Qty: 30 | Fill #1

## 2021-03-04 ENCOUNTER — Other Ambulatory Visit (HOSPITAL_COMMUNITY): Payer: Self-pay

## 2021-03-04 MED FILL — Omeprazole Cap Delayed Release 20 MG: ORAL | 30 days supply | Qty: 30 | Fill #0 | Status: AC

## 2021-03-16 ENCOUNTER — Other Ambulatory Visit (HOSPITAL_COMMUNITY): Payer: Self-pay | Admitting: Cardiology

## 2021-03-16 ENCOUNTER — Other Ambulatory Visit (HOSPITAL_COMMUNITY): Payer: Self-pay

## 2021-03-16 MED FILL — Finasteride Tab 1 MG: ORAL | 90 days supply | Qty: 90 | Fill #0 | Status: AC

## 2021-03-16 MED FILL — Rosuvastatin Calcium Tab 20 MG: ORAL | 30 days supply | Qty: 30 | Fill #0 | Status: AC

## 2021-03-17 ENCOUNTER — Other Ambulatory Visit (HOSPITAL_COMMUNITY): Payer: Self-pay

## 2021-03-17 MED ORDER — METOPROLOL TARTRATE 25 MG PO TABS
12.5000 mg | ORAL_TABLET | Freq: Two times a day (BID) | ORAL | 4 refills | Status: DC
  Filled 2021-03-17: qty 30, 30d supply, fill #0
  Filled 2021-04-14: qty 30, 30d supply, fill #1
  Filled 2021-05-15: qty 30, 30d supply, fill #2
  Filled 2021-06-12: qty 30, 30d supply, fill #3
  Filled 2021-07-14: qty 30, 30d supply, fill #4

## 2021-03-24 ENCOUNTER — Other Ambulatory Visit: Payer: Self-pay

## 2021-03-31 ENCOUNTER — Other Ambulatory Visit (HOSPITAL_COMMUNITY): Payer: Self-pay

## 2021-03-31 MED FILL — Omega-3-acid Ethyl Esters Cap 1 GM: ORAL | 30 days supply | Qty: 120 | Fill #0 | Status: AC

## 2021-04-02 ENCOUNTER — Encounter: Payer: Self-pay | Admitting: Dermatology

## 2021-04-02 ENCOUNTER — Ambulatory Visit: Payer: 59 | Admitting: Dermatology

## 2021-04-02 ENCOUNTER — Other Ambulatory Visit: Payer: Self-pay

## 2021-04-02 DIAGNOSIS — L814 Other melanin hyperpigmentation: Secondary | ICD-10-CM

## 2021-04-02 DIAGNOSIS — Z86018 Personal history of other benign neoplasm: Secondary | ICD-10-CM

## 2021-04-02 DIAGNOSIS — L578 Other skin changes due to chronic exposure to nonionizing radiation: Secondary | ICD-10-CM | POA: Diagnosis not present

## 2021-04-02 DIAGNOSIS — Z85820 Personal history of malignant melanoma of skin: Secondary | ICD-10-CM

## 2021-04-02 DIAGNOSIS — L821 Other seborrheic keratosis: Secondary | ICD-10-CM

## 2021-04-02 DIAGNOSIS — D229 Melanocytic nevi, unspecified: Secondary | ICD-10-CM

## 2021-04-02 DIAGNOSIS — B351 Tinea unguium: Secondary | ICD-10-CM | POA: Diagnosis not present

## 2021-04-02 DIAGNOSIS — Z1283 Encounter for screening for malignant neoplasm of skin: Secondary | ICD-10-CM

## 2021-04-02 DIAGNOSIS — B353 Tinea pedis: Secondary | ICD-10-CM | POA: Diagnosis not present

## 2021-04-02 DIAGNOSIS — L820 Inflamed seborrheic keratosis: Secondary | ICD-10-CM | POA: Diagnosis not present

## 2021-04-02 DIAGNOSIS — D1800 Hemangioma unspecified site: Secondary | ICD-10-CM

## 2021-04-02 MED ORDER — TAVABOROLE 5 % EX SOLN: CUTANEOUS | 11 refills | Status: DC

## 2021-04-02 MED ORDER — KETOCONAZOLE 2 % EX CREA: TOPICAL_CREAM | CUTANEOUS | 3 refills | Status: DC

## 2021-04-02 NOTE — Progress Notes (Signed)
Follow-Up Visit   Subjective  Carlos Blair is a 65 y.o. male who presents for the following: Annual Exam (Hx MM - L lat knee, Hx dysplastic nevi ). He has irritating spots on his face neck and trunk would like treated.  The following portions of the chart were reviewed this encounter and updated as appropriate:   Allergies  Meds  Problems  Med Hx  Surg Hx  Fam Hx     Review of Systems:  No other skin or systemic complaints except as noted in HPI or Assessment and Plan.  Objective  Well appearing patient in no apparent distress; mood and affect are within normal limits.  A full examination was performed including scalp, head, eyes, ears, nose, lips, neck, chest, axillae, abdomen, back, buttocks, bilateral upper extremities, bilateral lower extremities, hands, feet, fingers, toes, fingernails, and toenails. All findings within normal limits unless otherwise noted below.  Objective  Forehead x 8, neck x 10, trunk x 2 (20): Erythematous keratotic or waxy stuck-on papule or plaque.   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    Tavaborole 5 % SOLN - Left Foot - Anterior ketoconazole (NIZORAL) 2 % cream - Left Foot - Anterior  Skin cancer screening   Lentigines -  Scattered tan macules - Due to sun exposure - Benign-appering, observe - Recommend daily broad spectrum sunscreen SPF 30+ to sun-exposed areas, reapply every 2 hours as needed. - Call for any changes  Seborrheic Keratoses - Stuck-on, waxy, tan-brown papules and/or plaques  - Benign-appearing - Discussed benign etiology and prognosis. - Observe - Call for any changes  Melanocytic Nevi - Tan-brown and/or pink-flesh-colored symmetric macules and papules - Benign appearing on exam today - Observation - Call clinic for new or changing moles - Recommend daily use of broad spectrum spf 30+ sunscreen to sun-exposed areas.   Hemangiomas - Red papules - Discussed benign nature - Observe - Call for any changes  Actinic Damage - Chronic condition, secondary to cumulative UV/sun exposure - diffuse scaly erythematous macules with underlying dyspigmentation - Recommend daily broad spectrum sunscreen SPF 30+ to sun-exposed areas, reapply every 2 hours as needed.  - Staying in the shade or wearing long sleeves, sun glasses (UVA+UVB protection) and wide brim hats (4-inch brim around the entire circumference of the hat) are also recommended for sun protection.  - Call for new or changing lesions.  History of Melanoma - L lat knee (2010) - No evidence of recurrence today - No lymphadenopathy - Recommend regular full body skin exams - Recommend daily broad spectrum sunscreen SPF 30+ to sun-exposed areas, reapply every 2 hours as needed.  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 Early evolving stage of LM (early MIS) cannot be excluded. Excised 12/16/2015, margins free, R scapula, left mid lat volar forearm - No evidence of recurrence today - Recommend regular full body skin exams - Recommend daily broad spectrum sunscreen SPF 30+ to sun-exposed areas, reapply every 2 hours as needed.  - Call if any new or  changing lesions are noted between office visits  Skin cancer screening performed today.  Return in about 6 months (around 10/03/2021) for TBSE.  Luther Redo, CMA, am acting as scribe for Sarina Ser, MD .  Documentation: I have reviewed the above documentation for accuracy and completeness, and I agree with the above.  Sarina Ser, MD

## 2021-04-02 NOTE — Patient Instructions (Signed)

## 2021-04-03 ENCOUNTER — Other Ambulatory Visit (HOSPITAL_COMMUNITY): Payer: Self-pay

## 2021-04-03 MED FILL — Omeprazole Cap Delayed Release 20 MG: ORAL | 30 days supply | Qty: 30 | Fill #1 | Status: AC

## 2021-04-10 ENCOUNTER — Encounter: Payer: Self-pay | Admitting: Dermatology

## 2021-04-14 ENCOUNTER — Other Ambulatory Visit (HOSPITAL_COMMUNITY): Payer: Self-pay

## 2021-04-14 MED FILL — Rosuvastatin Calcium Tab 20 MG: ORAL | 30 days supply | Qty: 30 | Fill #1 | Status: AC

## 2021-04-27 MED FILL — Citalopram Hydrobromide Tab 20 MG (Base Equiv): ORAL | 90 days supply | Qty: 90 | Fill #0 | Status: AC

## 2021-04-28 ENCOUNTER — Other Ambulatory Visit (HOSPITAL_COMMUNITY): Payer: Self-pay

## 2021-05-07 ENCOUNTER — Other Ambulatory Visit (HOSPITAL_COMMUNITY): Payer: Self-pay

## 2021-05-07 MED FILL — Omeprazole Cap Delayed Release 20 MG: ORAL | 30 days supply | Qty: 30 | Fill #2 | Status: AC

## 2021-05-15 ENCOUNTER — Other Ambulatory Visit (HOSPITAL_COMMUNITY): Payer: Self-pay

## 2021-05-15 MED FILL — Rosuvastatin Calcium Tab 20 MG: ORAL | 30 days supply | Qty: 30 | Fill #2 | Status: AC

## 2021-05-15 MED FILL — Omega-3-acid Ethyl Esters Cap 1 GM: ORAL | 30 days supply | Qty: 120 | Fill #1 | Status: AC

## 2021-05-26 ENCOUNTER — Other Ambulatory Visit (HOSPITAL_COMMUNITY): Payer: Self-pay

## 2021-06-11 DIAGNOSIS — Z0000 Encounter for general adult medical examination without abnormal findings: Secondary | ICD-10-CM | POA: Diagnosis not present

## 2021-06-11 DIAGNOSIS — Z125 Encounter for screening for malignant neoplasm of prostate: Secondary | ICD-10-CM | POA: Diagnosis not present

## 2021-06-11 DIAGNOSIS — Z13220 Encounter for screening for lipoid disorders: Secondary | ICD-10-CM | POA: Diagnosis not present

## 2021-06-12 ENCOUNTER — Other Ambulatory Visit (HOSPITAL_COMMUNITY): Payer: Self-pay

## 2021-06-12 MED FILL — Omeprazole Cap Delayed Release 20 MG: ORAL | 30 days supply | Qty: 30 | Fill #3 | Status: AC

## 2021-06-12 MED FILL — Rosuvastatin Calcium Tab 20 MG: ORAL | 30 days supply | Qty: 30 | Fill #3 | Status: AC

## 2021-06-18 DIAGNOSIS — R39198 Other difficulties with micturition: Secondary | ICD-10-CM | POA: Diagnosis not present

## 2021-06-18 DIAGNOSIS — E782 Mixed hyperlipidemia: Secondary | ICD-10-CM | POA: Diagnosis not present

## 2021-06-18 DIAGNOSIS — I25719 Atherosclerosis of autologous vein coronary artery bypass graft(s) with unspecified angina pectoris: Secondary | ICD-10-CM | POA: Diagnosis not present

## 2021-06-18 DIAGNOSIS — I10 Essential (primary) hypertension: Secondary | ICD-10-CM | POA: Diagnosis not present

## 2021-07-07 ENCOUNTER — Other Ambulatory Visit (HOSPITAL_COMMUNITY): Payer: Self-pay

## 2021-07-07 MED FILL — Omega-3-acid Ethyl Esters Cap 1 GM: ORAL | 30 days supply | Qty: 120 | Fill #2 | Status: AC

## 2021-07-08 ENCOUNTER — Other Ambulatory Visit (HOSPITAL_COMMUNITY): Payer: Self-pay

## 2021-07-14 ENCOUNTER — Other Ambulatory Visit (HOSPITAL_COMMUNITY): Payer: Self-pay

## 2021-07-14 MED FILL — Rosuvastatin Calcium Tab 20 MG: ORAL | 30 days supply | Qty: 30 | Fill #4 | Status: AC

## 2021-07-14 MED FILL — Finasteride Tab 1 MG: ORAL | 90 days supply | Qty: 90 | Fill #1 | Status: AC

## 2021-07-14 MED FILL — Omeprazole Cap Delayed Release 20 MG: ORAL | 30 days supply | Qty: 30 | Fill #4 | Status: AC

## 2021-07-30 ENCOUNTER — Other Ambulatory Visit (HOSPITAL_COMMUNITY): Payer: Self-pay

## 2021-07-30 MED FILL — Citalopram Hydrobromide Tab 20 MG (Base Equiv): ORAL | 90 days supply | Qty: 90 | Fill #1 | Status: AC

## 2021-08-10 DIAGNOSIS — I25719 Atherosclerosis of autologous vein coronary artery bypass graft(s) with unspecified angina pectoris: Secondary | ICD-10-CM | POA: Diagnosis not present

## 2021-08-13 ENCOUNTER — Other Ambulatory Visit (HOSPITAL_COMMUNITY): Payer: Self-pay

## 2021-08-13 MED ORDER — METOPROLOL TARTRATE 25 MG PO TABS
12.5000 mg | ORAL_TABLET | Freq: Two times a day (BID) | ORAL | 4 refills | Status: DC
  Filled 2021-08-13: qty 30, 30d supply, fill #0
  Filled 2021-09-14: qty 30, 30d supply, fill #1
  Filled 2021-10-12: qty 30, 30d supply, fill #2
  Filled 2021-11-12: qty 30, 30d supply, fill #3
  Filled 2021-12-11: qty 30, 30d supply, fill #4

## 2021-08-13 MED FILL — Rosuvastatin Calcium Tab 20 MG: ORAL | 30 days supply | Qty: 30 | Fill #5 | Status: AC

## 2021-08-13 MED FILL — Vardenafil HCl Tab 20 MG: ORAL | 30 days supply | Qty: 6 | Fill #0 | Status: CN

## 2021-08-13 MED FILL — Vardenafil HCl Tab 20 MG: ORAL | 30 days supply | Qty: 5 | Fill #0 | Status: CN

## 2021-08-14 ENCOUNTER — Other Ambulatory Visit (HOSPITAL_COMMUNITY): Payer: Self-pay

## 2021-08-14 MED FILL — Vardenafil HCl Tab 20 MG: ORAL | 30 days supply | Qty: 6 | Fill #0 | Status: CN

## 2021-08-18 DIAGNOSIS — I25719 Atherosclerosis of autologous vein coronary artery bypass graft(s) with unspecified angina pectoris: Secondary | ICD-10-CM | POA: Diagnosis not present

## 2021-08-18 DIAGNOSIS — E782 Mixed hyperlipidemia: Secondary | ICD-10-CM | POA: Diagnosis not present

## 2021-08-18 DIAGNOSIS — R0602 Shortness of breath: Secondary | ICD-10-CM | POA: Diagnosis not present

## 2021-08-18 DIAGNOSIS — I10 Essential (primary) hypertension: Secondary | ICD-10-CM | POA: Diagnosis not present

## 2021-08-19 ENCOUNTER — Other Ambulatory Visit (HOSPITAL_COMMUNITY): Payer: Self-pay

## 2021-08-19 MED FILL — Vardenafil HCl Tab 20 MG: ORAL | 30 days supply | Qty: 6 | Fill #0 | Status: CN

## 2021-08-26 ENCOUNTER — Other Ambulatory Visit (HOSPITAL_COMMUNITY): Payer: Self-pay

## 2021-08-27 ENCOUNTER — Other Ambulatory Visit (HOSPITAL_COMMUNITY): Payer: Self-pay

## 2021-08-27 MED FILL — Omega-3-acid Ethyl Esters Cap 1 GM: ORAL | 30 days supply | Qty: 120 | Fill #3 | Status: AC

## 2021-08-27 MED FILL — Omeprazole Cap Delayed Release 20 MG: ORAL | 30 days supply | Qty: 30 | Fill #5 | Status: AC

## 2021-09-07 DIAGNOSIS — R0602 Shortness of breath: Secondary | ICD-10-CM | POA: Diagnosis not present

## 2021-09-14 ENCOUNTER — Other Ambulatory Visit (HOSPITAL_COMMUNITY): Payer: Self-pay

## 2021-09-14 MED FILL — Rosuvastatin Calcium Tab 20 MG: ORAL | 30 days supply | Qty: 30 | Fill #6 | Status: AC

## 2021-09-30 ENCOUNTER — Ambulatory Visit: Payer: 59 | Admitting: Dermatology

## 2021-09-30 ENCOUNTER — Other Ambulatory Visit: Payer: Self-pay

## 2021-09-30 DIAGNOSIS — D229 Melanocytic nevi, unspecified: Secondary | ICD-10-CM | POA: Diagnosis not present

## 2021-09-30 DIAGNOSIS — L820 Inflamed seborrheic keratosis: Secondary | ICD-10-CM | POA: Diagnosis not present

## 2021-09-30 DIAGNOSIS — Z85820 Personal history of malignant melanoma of skin: Secondary | ICD-10-CM | POA: Diagnosis not present

## 2021-09-30 DIAGNOSIS — L578 Other skin changes due to chronic exposure to nonionizing radiation: Secondary | ICD-10-CM

## 2021-09-30 DIAGNOSIS — Z1283 Encounter for screening for malignant neoplasm of skin: Secondary | ICD-10-CM | POA: Diagnosis not present

## 2021-09-30 DIAGNOSIS — L821 Other seborrheic keratosis: Secondary | ICD-10-CM | POA: Diagnosis not present

## 2021-09-30 DIAGNOSIS — D1800 Hemangioma unspecified site: Secondary | ICD-10-CM | POA: Diagnosis not present

## 2021-09-30 DIAGNOSIS — L814 Other melanin hyperpigmentation: Secondary | ICD-10-CM | POA: Diagnosis not present

## 2021-09-30 DIAGNOSIS — Z86018 Personal history of other benign neoplasm: Secondary | ICD-10-CM | POA: Diagnosis not present

## 2021-09-30 NOTE — Progress Notes (Signed)
Follow-Up Visit   Subjective  TRAN ARZUAGA is a 65 y.o. male who presents for the following: Annual Exam (Hx MM, dysplastic nevi - patient has noticed lesions around his neck that he would like checked and treated today.). The patient presents for Total-Body Skin Exam (TBSE) for skin cancer screening and mole check.  The following portions of the chart were reviewed this encounter and updated as appropriate:   Tobacco  Allergies  Meds  Problems  Med Hx  Surg Hx  Fam Hx     Review of Systems:  No other skin or systemic complaints except as noted in HPI or Assessment and Plan.  Objective  Well appearing patient in no apparent distress; mood and affect are within normal limits.  A full examination was performed including scalp, head, eyes, ears, nose, lips, neck, chest, axillae, abdomen, back, buttocks, bilateral upper extremities, bilateral lower extremities, hands, feet, fingers, toes, fingernails, and toenails. All findings within normal limits unless otherwise noted below.  Neck x 15, back x 3 (18) Erythematous keratotic or waxy stuck-on papule or plaque.    Assessment & Plan  Inflamed seborrheic keratosis Neck x 15, back x 3  Destruction of lesion - Neck x 15, back x 3 Complexity: simple   Destruction method: cryotherapy   Informed consent: discussed and consent obtained   Timeout:  patient name, date of birth, surgical site, and procedure verified Lesion destroyed using liquid nitrogen: Yes   Region frozen until ice ball extended beyond lesion: Yes   Outcome: patient tolerated procedure well with no complications   Post-procedure details: wound care instructions given    Skin cancer screening  Lentigines - Scattered tan macules - Due to sun exposure - Benign-appearing, observe - Recommend daily broad spectrum sunscreen SPF 30+ to sun-exposed areas, reapply every 2 hours as needed. - Call for any changes  Seborrheic Keratoses - Stuck-on, waxy, tan-brown  papules and/or plaques  - Benign-appearing - Discussed benign etiology and prognosis. - Observe - Call for any changes  Melanocytic Nevi - Tan-brown and/or pink-flesh-colored symmetric macules and papules - Benign appearing on exam today - Observation - Call clinic for new or changing moles - Recommend daily use of broad spectrum spf 30+ sunscreen to sun-exposed areas.   Hemangiomas - Red papules - Discussed benign nature - Observe - Call for any changes  Actinic Damage - Chronic condition, secondary to cumulative UV/sun exposure - diffuse scaly erythematous macules with underlying dyspigmentation - Recommend daily broad spectrum sunscreen SPF 30+ to sun-exposed areas, reapply every 2 hours as needed.  - Staying in the shade or wearing long sleeves, sun glasses (UVA+UVB protection) and wide brim hats (4-inch brim around the entire circumference of the hat) are also recommended for sun protection.  - Call for new or changing lesions.  History of Melanoma - No evidence of recurrence today - No lymphadenopathy - Recommend regular full body skin exams - Recommend daily broad spectrum sunscreen SPF 30+ to sun-exposed areas, reapply every 2 hours as needed.  - Call if any new or changing lesions are noted between office visits  History of Dysplastic Nevi - No evidence of recurrence today - Recommend regular full body skin exams - Recommend daily broad spectrum sunscreen SPF 30+ to sun-exposed areas, reapply every 2 hours as needed.  - Call if any new or changing lesions are noted between office visits  Skin cancer screening performed today.  Return in about 6 months (around 03/30/2022) for TBSE.  Guy Sandifer  Earnie Larsson, CMA, am acting as scribe for Sarina Ser, MD . Documentation: I have reviewed the above documentation for accuracy and completeness, and I agree with the above.  Sarina Ser, MD

## 2021-09-30 NOTE — Patient Instructions (Signed)

## 2021-10-05 ENCOUNTER — Encounter: Payer: Self-pay | Admitting: Dermatology

## 2021-10-12 ENCOUNTER — Other Ambulatory Visit (HOSPITAL_COMMUNITY): Payer: Self-pay

## 2021-10-12 MED FILL — Omeprazole Cap Delayed Release 20 MG: ORAL | 30 days supply | Qty: 30 | Fill #6 | Status: AC

## 2021-10-12 MED FILL — Rosuvastatin Calcium Tab 20 MG: ORAL | 30 days supply | Qty: 30 | Fill #7 | Status: AC

## 2021-10-28 ENCOUNTER — Other Ambulatory Visit (HOSPITAL_COMMUNITY): Payer: Self-pay

## 2021-10-28 MED FILL — Omega-3-acid Ethyl Esters Cap 1 GM: ORAL | 30 days supply | Qty: 120 | Fill #4 | Status: AC

## 2021-10-30 ENCOUNTER — Other Ambulatory Visit (HOSPITAL_COMMUNITY): Payer: Self-pay

## 2021-11-02 ENCOUNTER — Other Ambulatory Visit (HOSPITAL_COMMUNITY): Payer: Self-pay

## 2021-11-02 MED ORDER — CITALOPRAM HYDROBROMIDE 20 MG PO TABS
20.0000 mg | ORAL_TABLET | Freq: Every day | ORAL | 2 refills | Status: DC
  Filled 2021-11-02: qty 90, 90d supply, fill #0
  Filled 2022-02-10: qty 90, 90d supply, fill #1
  Filled 2022-06-08: qty 90, 90d supply, fill #2

## 2021-11-05 ENCOUNTER — Other Ambulatory Visit (HOSPITAL_COMMUNITY): Payer: Self-pay

## 2021-11-12 ENCOUNTER — Other Ambulatory Visit (HOSPITAL_COMMUNITY): Payer: Self-pay

## 2021-11-16 ENCOUNTER — Other Ambulatory Visit (HOSPITAL_COMMUNITY): Payer: Self-pay

## 2021-11-16 MED FILL — Rosuvastatin Calcium Tab 20 MG: ORAL | 30 days supply | Qty: 30 | Fill #8 | Status: AC

## 2021-11-18 ENCOUNTER — Other Ambulatory Visit (HOSPITAL_COMMUNITY): Payer: Self-pay

## 2021-11-18 MED ORDER — FINASTERIDE 1 MG PO TABS
1.0000 mg | ORAL_TABLET | Freq: Every day | ORAL | 3 refills | Status: DC
Start: 2021-11-18 — End: 2023-02-28
  Filled 2021-11-18: qty 90, 90d supply, fill #0
  Filled 2022-04-15: qty 90, 90d supply, fill #1
  Filled 2022-08-17: qty 90, 90d supply, fill #2
  Filled 2022-10-25: qty 90, 90d supply, fill #3

## 2021-11-24 ENCOUNTER — Other Ambulatory Visit (HOSPITAL_COMMUNITY): Payer: Self-pay

## 2021-11-25 ENCOUNTER — Other Ambulatory Visit (HOSPITAL_COMMUNITY): Payer: Self-pay

## 2021-11-25 MED ORDER — OMEPRAZOLE 20 MG PO CPDR
20.0000 mg | DELAYED_RELEASE_CAPSULE | Freq: Every day | ORAL | 11 refills | Status: DC
  Filled 2021-11-25: qty 30, 30d supply, fill #0
  Filled 2022-01-11: qty 30, 30d supply, fill #1
  Filled 2022-03-02: qty 30, 30d supply, fill #2
  Filled 2022-04-15: qty 30, 30d supply, fill #3
  Filled 2022-05-12: qty 30, 30d supply, fill #4
  Filled 2022-06-08: qty 30, 30d supply, fill #5
  Filled 2022-07-09: qty 30, 30d supply, fill #6
  Filled 2022-08-17: qty 30, 30d supply, fill #7
  Filled 2022-10-05: qty 30, 30d supply, fill #8

## 2021-12-11 ENCOUNTER — Other Ambulatory Visit (HOSPITAL_COMMUNITY): Payer: Self-pay

## 2021-12-14 DIAGNOSIS — I10 Essential (primary) hypertension: Secondary | ICD-10-CM | POA: Diagnosis not present

## 2021-12-14 DIAGNOSIS — E782 Mixed hyperlipidemia: Secondary | ICD-10-CM | POA: Diagnosis not present

## 2021-12-16 ENCOUNTER — Other Ambulatory Visit (HOSPITAL_COMMUNITY): Payer: Self-pay

## 2021-12-16 MED FILL — Rosuvastatin Calcium Tab 20 MG: ORAL | 30 days supply | Qty: 30 | Fill #9 | Status: AC

## 2021-12-21 ENCOUNTER — Other Ambulatory Visit (HOSPITAL_COMMUNITY): Payer: Self-pay

## 2021-12-21 DIAGNOSIS — I25719 Atherosclerosis of autologous vein coronary artery bypass graft(s) with unspecified angina pectoris: Secondary | ICD-10-CM | POA: Diagnosis not present

## 2021-12-21 DIAGNOSIS — Z0000 Encounter for general adult medical examination without abnormal findings: Secondary | ICD-10-CM | POA: Diagnosis not present

## 2021-12-21 DIAGNOSIS — I10 Essential (primary) hypertension: Secondary | ICD-10-CM | POA: Diagnosis not present

## 2021-12-21 DIAGNOSIS — E782 Mixed hyperlipidemia: Secondary | ICD-10-CM | POA: Diagnosis not present

## 2021-12-21 MED ORDER — ROSUVASTATIN CALCIUM 20 MG PO TABS
20.0000 mg | ORAL_TABLET | Freq: Every day | ORAL | 11 refills | Status: DC
  Filled 2021-12-21: qty 30, 30d supply, fill #0
  Filled 2022-01-11: qty 90, 90d supply, fill #0
  Filled 2022-04-22: qty 90, 90d supply, fill #1
  Filled 2022-07-23: qty 90, 90d supply, fill #2
  Filled 2022-10-25: qty 90, 90d supply, fill #3

## 2022-01-11 ENCOUNTER — Other Ambulatory Visit (HOSPITAL_COMMUNITY): Payer: Self-pay

## 2022-01-12 ENCOUNTER — Other Ambulatory Visit (HOSPITAL_COMMUNITY): Payer: Self-pay

## 2022-01-12 MED ORDER — METOPROLOL TARTRATE 25 MG PO TABS
12.5000 mg | ORAL_TABLET | Freq: Two times a day (BID) | ORAL | 4 refills | Status: DC
  Filled 2022-01-12: qty 30, 30d supply, fill #0
  Filled 2022-02-10: qty 30, 30d supply, fill #1
  Filled 2022-03-12: qty 30, 30d supply, fill #2
  Filled 2022-04-01: qty 30, 30d supply, fill #3
  Filled 2022-05-12: qty 30, 30d supply, fill #4

## 2022-01-29 ENCOUNTER — Other Ambulatory Visit (HOSPITAL_COMMUNITY): Payer: Self-pay

## 2022-02-01 ENCOUNTER — Other Ambulatory Visit (HOSPITAL_COMMUNITY): Payer: Self-pay

## 2022-02-01 MED ORDER — OMEGA-3-ACID ETHYL ESTERS 1 G PO CAPS
2.0000 | ORAL_CAPSULE | Freq: Two times a day (BID) | ORAL | 11 refills | Status: DC
  Filled 2022-02-01: qty 360, 90d supply, fill #0
  Filled 2022-05-24: qty 360, 90d supply, fill #1

## 2022-02-11 ENCOUNTER — Other Ambulatory Visit (HOSPITAL_COMMUNITY): Payer: Self-pay

## 2022-03-03 ENCOUNTER — Other Ambulatory Visit (HOSPITAL_COMMUNITY): Payer: Self-pay

## 2022-03-10 DIAGNOSIS — E782 Mixed hyperlipidemia: Secondary | ICD-10-CM | POA: Diagnosis not present

## 2022-03-10 DIAGNOSIS — I25710 Atherosclerosis of autologous vein coronary artery bypass graft(s) with unstable angina pectoris: Secondary | ICD-10-CM | POA: Diagnosis not present

## 2022-03-10 DIAGNOSIS — I10 Essential (primary) hypertension: Secondary | ICD-10-CM | POA: Diagnosis not present

## 2022-03-12 ENCOUNTER — Other Ambulatory Visit (HOSPITAL_COMMUNITY): Payer: Self-pay

## 2022-04-01 ENCOUNTER — Other Ambulatory Visit (HOSPITAL_COMMUNITY): Payer: Self-pay

## 2022-04-05 ENCOUNTER — Ambulatory Visit: Payer: 59 | Admitting: Dermatology

## 2022-04-16 ENCOUNTER — Other Ambulatory Visit (HOSPITAL_COMMUNITY): Payer: Self-pay

## 2022-04-22 ENCOUNTER — Other Ambulatory Visit (HOSPITAL_COMMUNITY): Payer: Self-pay

## 2022-04-27 ENCOUNTER — Other Ambulatory Visit (HOSPITAL_COMMUNITY): Payer: Self-pay

## 2022-05-12 ENCOUNTER — Other Ambulatory Visit (HOSPITAL_COMMUNITY): Payer: Self-pay

## 2022-05-24 ENCOUNTER — Other Ambulatory Visit (HOSPITAL_COMMUNITY): Payer: Self-pay

## 2022-06-08 ENCOUNTER — Other Ambulatory Visit (HOSPITAL_COMMUNITY): Payer: Self-pay

## 2022-06-08 MED ORDER — METOPROLOL TARTRATE 25 MG PO TABS
12.5000 mg | ORAL_TABLET | Freq: Two times a day (BID) | ORAL | 2 refills | Status: DC
  Filled 2022-06-08: qty 30, 30d supply, fill #0
  Filled 2022-07-09: qty 30, 30d supply, fill #1
  Filled 2022-08-10: qty 30, 30d supply, fill #2

## 2022-06-10 ENCOUNTER — Ambulatory Visit: Payer: 59 | Admitting: Dermatology

## 2022-06-10 ENCOUNTER — Other Ambulatory Visit (HOSPITAL_COMMUNITY): Payer: Self-pay

## 2022-06-10 DIAGNOSIS — D1800 Hemangioma unspecified site: Secondary | ICD-10-CM

## 2022-06-10 DIAGNOSIS — L820 Inflamed seborrheic keratosis: Secondary | ICD-10-CM | POA: Diagnosis not present

## 2022-06-10 DIAGNOSIS — B351 Tinea unguium: Secondary | ICD-10-CM | POA: Diagnosis not present

## 2022-06-10 DIAGNOSIS — L409 Psoriasis, unspecified: Secondary | ICD-10-CM | POA: Diagnosis not present

## 2022-06-10 DIAGNOSIS — L821 Other seborrheic keratosis: Secondary | ICD-10-CM

## 2022-06-10 DIAGNOSIS — L578 Other skin changes due to chronic exposure to nonionizing radiation: Secondary | ICD-10-CM | POA: Diagnosis not present

## 2022-06-10 DIAGNOSIS — D229 Melanocytic nevi, unspecified: Secondary | ICD-10-CM | POA: Diagnosis not present

## 2022-06-10 DIAGNOSIS — Z86018 Personal history of other benign neoplasm: Secondary | ICD-10-CM

## 2022-06-10 DIAGNOSIS — Z85820 Personal history of malignant melanoma of skin: Secondary | ICD-10-CM | POA: Diagnosis not present

## 2022-06-10 DIAGNOSIS — Z1283 Encounter for screening for malignant neoplasm of skin: Secondary | ICD-10-CM | POA: Diagnosis not present

## 2022-06-10 DIAGNOSIS — B353 Tinea pedis: Secondary | ICD-10-CM

## 2022-06-10 DIAGNOSIS — C4372 Malignant melanoma of left lower limb, including hip: Secondary | ICD-10-CM

## 2022-06-10 DIAGNOSIS — L814 Other melanin hyperpigmentation: Secondary | ICD-10-CM

## 2022-06-10 MED ORDER — TAVABOROLE 5 % EX SOLN: CUTANEOUS | 11 refills | Status: DC

## 2022-06-10 MED ORDER — MOMETASONE FUROATE 0.1 % EX CREA
1.0000 | TOPICAL_CREAM | CUTANEOUS | 5 refills | Status: DC
  Filled 2022-06-10: qty 45, 30d supply, fill #0

## 2022-06-10 MED ORDER — KETOCONAZOLE 2 % EX CREA
1.0000 | TOPICAL_CREAM | Freq: Every day | CUTANEOUS | 11 refills | Status: DC
  Filled 2022-06-10: qty 60, 30d supply, fill #0

## 2022-06-10 NOTE — Progress Notes (Signed)
Follow-Up Visit   Subjective  Carlos Blair is a 65 y.o. male who presents for the following: Annual Exam (Hx MM, dysplastic nevi ). The patient presents for Total-Body Skin Exam (TBSE) for skin cancer screening and mole check.  The patient has spots, moles and lesions to be evaluated, some may be new or changing.  The following portions of the chart were reviewed this encounter and updated as appropriate:   Tobacco  Allergies  Meds  Problems  Med Hx  Surg Hx  Fam Hx     Review of Systems:  No other skin or systemic complaints except as noted in HPI or Assessment and Plan.  Objective  Well appearing patient in no apparent distress; mood and affect are within normal limits.  A full examination was performed including scalp, head, eyes, ears, nose, lips, neck, chest, axillae, abdomen, back, buttocks, bilateral upper extremities, bilateral lower extremities, hands, feet, fingers, toes, fingernails, and toenails. All findings within normal limits unless otherwise noted below.  R forehead x 1, R side x 1, back x 1 (3) Erythematous stuck-on, waxy papule or plaque  B/L foot Scale of the feet, toenail dystrophy.   Assessment & Plan  Inflamed seborrheic keratosis R forehead x 1, R side x 1, back x 1 Symptomatic, irritating, patient would like treated. Destruction of lesion - R forehead x 1, R side x 1, back x 1 Complexity: simple   Destruction method: cryotherapy   Informed consent: discussed and consent obtained   Timeout:  patient name, date of birth, surgical site, and procedure verified Lesion destroyed using liquid nitrogen: Yes   Region frozen until ice ball extended beyond lesion: Yes   Outcome: patient tolerated procedure well with no complications   Post-procedure details: wound care instructions given    Psoriasis B/L elbow Psoriasis is a chronic non-curable, but treatable genetic/hereditary disease that may have other systemic features affecting other organ  systems such as joints (Psoriatic Arthritis). It is associated with an increased risk of inflammatory bowel disease, heart disease, non-alcoholic fatty liver disease, and depression.    Start mometasone cream to aa's QD-BID up to 5d/wk. Topical steroids (such as triamcinolone, fluocinolone, fluocinonide, mometasone, clobetasol, halobetasol, betamethasone, hydrocortisone) can cause thinning and lightening of the skin if they are used for too long in the same area. Your physician has selected the right strength medicine for your problem and area affected on the body. Please use your medication only as directed by your physician to prevent side effects.   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 Condition is symptomatic / bothersome to patient. Not to goal. Continue : Tavaborole solution QHS and  Ketoconazole 2% cream QHS.   Related Medications Tavaborole 5 % SOLN Apply to toenails QHS 5d/wk. ketoconazole (NIZORAL) 2 % cream Apply 1 application topically to feet at bedtime.  Lentigines - Scattered tan macules - Due to sun exposure - Benign-appearing, observe - Recommend daily broad spectrum sunscreen SPF 30+ to sun-exposed areas, reapply every 2 hours as needed. - Call for any changes  Seborrheic Keratoses - Stuck-on, waxy, tan-brown papules and/or plaques  - Benign-appearing - Discussed benign etiology and prognosis. - Observe - Call for any changes  Melanocytic Nevi -  Tan-brown and/or pink-flesh-colored symmetric macules and papules - Benign appearing on exam today - Observation - Call clinic for new or changing moles - Recommend daily use of broad spectrum spf 30+ sunscreen to sun-exposed areas.   Hemangiomas - Red  papules - Discussed benign nature - Observe - Call for any changes  Actinic Damage - Chronic condition, secondary to cumulative UV/sun exposure - diffuse scaly erythematous macules with underlying dyspigmentation - Recommend daily broad spectrum sunscreen SPF 30+ to sun-exposed areas, reapply every 2 hours as needed.  - Staying in the shade or wearing long sleeves, sun glasses (UVA+UVB protection) and wide brim hats (4-inch brim around the entire circumference of the hat) are also recommended for sun protection.  - Call for new or changing lesions.  History of Melanoma - No evidence of recurrence today - No lymphadenopathy - Recommend regular full body skin exams - Recommend daily broad spectrum sunscreen SPF 30+ to sun-exposed areas, reapply every 2 hours as needed.  - Call if any new or changing lesions are noted between office visits  History of Dysplastic Nevi - No evidence of recurrence today - Recommend regular full body skin exams - Recommend daily broad spectrum sunscreen SPF 30+ to sun-exposed areas, reapply every 2 hours as needed.  - Call if any new or changing lesions are noted between office visits  Skin cancer screening performed today.  Return in about 6 months (around 12/11/2022) for TBSE.  Luther Redo, CMA, am acting as scribe for Sarina Ser, MD . Documentation: I have reviewed the above documentation for accuracy and completeness, and I agree with the above.  Sarina Ser, MD

## 2022-06-10 NOTE — Patient Instructions (Signed)
Due to recent changes in healthcare laws, you may see results of your pathology and/or laboratory studies on MyChart before the doctors have had a chance to review them. We understand that in some cases there may be results that are confusing or concerning to you. Please understand that not all results are received at the same time and often the doctors may need to interpret multiple results in order to provide you with the best plan of care or course of treatment. Therefore, we ask that you please give us 2 business days to thoroughly review all your results before contacting the office for clarification. Should we see a critical lab result, you will be contacted sooner.   If You Need Anything After Your Visit  If you have any questions or concerns for your doctor, please call our main line at [phone] and press option 4 to reach your doctor's medical assistant. If no one answers, please leave a voicemail as directed and we will return your call as soon as possible. Messages left after 4 pm will be answered the following business day.   You may also send us a message via MyChart. We typically respond to MyChart messages within 1-2 business days.  For prescription refills, please ask your pharmacy to contact our office. Our fax number is [phone].  If you have an urgent issue when the clinic is closed that cannot wait until the next business day, you can page your doctor at the number below.    Please note that while we do our best to be available for urgent issues outside of office hours, we are not available 24/7.   If you have an urgent issue and are unable to reach us, you may choose to seek medical care at your doctor's office, retail clinic, urgent care center, or emergency room.  If you have a medical emergency, please immediately call 911 or go to the emergency department.  Pager Numbers  - Dr. Kowalski: [phone]  - Dr. Moye: [phone]  - Dr. Stewart:  [phone]  In the event of inclement weather, please call our main line at [phone] for an update on the status of any delays or closures.  Dermatology Medication Tips: Please keep the boxes that topical medications come in in order to help keep track of the instructions about where and how to use these. Pharmacies typically print the medication instructions only on the boxes and not directly on the medication tubes.   If your medication is too expensive, please contact our office at [phone] option 4 or send us a message through MyChart.   We are unable to tell what your co-pay for medications will be in advance as this is different depending on your insurance coverage. However, we may be able to find a substitute medication at lower cost or fill out paperwork to get insurance to cover a needed medication.   If a prior authorization is required to get your medication covered by your insurance company, please allow us 1-2 business days to complete this process.  Drug prices often vary depending on where the prescription is filled and some pharmacies may offer cheaper prices.  The website www.goodrx.com contains coupons for medications through different pharmacies. The prices here do not account for what the cost may be with help from insurance (it may be cheaper with your insurance), but the website can give you the price if you did not use any insurance.  - You can print the associated coupon and take it with   your prescription to the pharmacy.  - You may also stop by our office during regular business hours and pick up a GoodRx coupon card.  - If you need your prescription sent electronically to a different pharmacy, notify our office through [facility] MyChart or by phone at [phone] option 4.     Si Usted Necesita Algo Despus de Su Visita  Tambin puede enviarnos un mensaje a travs de MyChart. Por lo general respondemos a los mensajes de MyChart en el transcurso de 1 a 2  das hbiles.  Para renovar recetas, por favor pida a su farmacia que se ponga en contacto con nuestra oficina. Nuestro nmero de fax es el [phone].  Si tiene un asunto urgente cuando la clnica est cerrada y que no puede esperar hasta el siguiente da hbil, puede llamar/localizar a su doctor(a) al nmero que aparece a continuacin.   Por favor, tenga en cuenta que aunque hacemos todo lo posible para estar disponibles para asuntos urgentes fuera del horario de oficina, no estamos disponibles las 24 horas del da, los 7 das de la semana.   Si tiene un problema urgente y no puede comunicarse con nosotros, puede optar por buscar atencin mdica  en el consultorio de su doctor(a), en una clnica privada, en un centro de atencin urgente o en una sala de emergencias.  Si tiene una emergencia mdica, por favor llame inmediatamente al 911 o vaya a la sala de emergencias.  Nmeros de bper  - Dr. Kowalski: [phone]  - Dra. Moye: [phone]  - Dra. Stewart: [phone]  En caso de inclemencias del tiempo, por favor llame a nuestra lnea principal al [phone] para una actualizacin sobre el estado de cualquier retraso o cierre.  Consejos para la medicacin en dermatologa: Por favor, guarde las cajas en las que vienen los medicamentos de uso tpico para ayudarle a seguir las instrucciones sobre dnde y cmo usarlos. Las farmacias generalmente imprimen las instrucciones del medicamento slo en las cajas y no directamente en los tubos del medicamento.   Si su medicamento es muy caro, por favor, pngase en contacto con nuestra oficina llamando al [phone] y presione la opcin 4 o envenos un mensaje a travs de MyChart.   No podemos decirle cul ser su copago por los medicamentos por adelantado ya que esto es diferente dependiendo de la cobertura de su seguro. Sin embargo, es posible que podamos encontrar un medicamento sustituto a menor costo o llenar un formulario para que el  seguro cubra el medicamento que se considera necesario.   Si se requiere una autorizacin previa para que su compaa de seguros cubra su medicamento, por favor permtanos de 1 a 2 das hbiles para completar este proceso.  Los precios de los medicamentos varan con frecuencia dependiendo del lugar de dnde se surte la receta y alguna farmacias pueden ofrecer precios ms baratos.  El sitio web www.goodrx.com tiene cupones para medicamentos de diferentes farmacias. Los precios aqu no tienen en cuenta lo que podra costar con la ayuda del seguro (puede ser ms barato con su seguro), pero el sitio web puede darle el precio si no utiliz ningn seguro.  - Puede imprimir el cupn correspondiente y llevarlo con su receta a la farmacia.  - Tambin puede pasar por nuestra oficina durante el horario de atencin regular y recoger una tarjeta de cupones de GoodRx.  - Si necesita que su receta se enve electrnicamente a una farmacia diferente, informe a nuestra oficina a travs de MyChart de [facility]   o por telfono llamando al [phone] y presione la opcin 4.  

## 2022-06-13 ENCOUNTER — Encounter: Payer: Self-pay | Admitting: Dermatology

## 2022-06-14 DIAGNOSIS — R972 Elevated prostate specific antigen [PSA]: Secondary | ICD-10-CM | POA: Diagnosis not present

## 2022-06-14 DIAGNOSIS — E782 Mixed hyperlipidemia: Secondary | ICD-10-CM | POA: Diagnosis not present

## 2022-06-14 DIAGNOSIS — I10 Essential (primary) hypertension: Secondary | ICD-10-CM | POA: Diagnosis not present

## 2022-06-21 DIAGNOSIS — I25710 Atherosclerosis of autologous vein coronary artery bypass graft(s) with unstable angina pectoris: Secondary | ICD-10-CM | POA: Diagnosis not present

## 2022-06-21 DIAGNOSIS — E782 Mixed hyperlipidemia: Secondary | ICD-10-CM | POA: Diagnosis not present

## 2022-06-21 DIAGNOSIS — F419 Anxiety disorder, unspecified: Secondary | ICD-10-CM | POA: Diagnosis not present

## 2022-06-21 DIAGNOSIS — F32A Depression, unspecified: Secondary | ICD-10-CM | POA: Diagnosis not present

## 2022-06-21 DIAGNOSIS — I10 Essential (primary) hypertension: Secondary | ICD-10-CM | POA: Diagnosis not present

## 2022-07-09 ENCOUNTER — Other Ambulatory Visit (HOSPITAL_COMMUNITY): Payer: Self-pay

## 2022-07-23 ENCOUNTER — Other Ambulatory Visit (HOSPITAL_COMMUNITY): Payer: Self-pay

## 2022-08-10 ENCOUNTER — Other Ambulatory Visit (HOSPITAL_COMMUNITY): Payer: Self-pay

## 2022-08-17 ENCOUNTER — Other Ambulatory Visit (HOSPITAL_COMMUNITY): Payer: Self-pay

## 2022-09-07 DIAGNOSIS — E782 Mixed hyperlipidemia: Secondary | ICD-10-CM | POA: Diagnosis not present

## 2022-09-07 DIAGNOSIS — I10 Essential (primary) hypertension: Secondary | ICD-10-CM | POA: Diagnosis not present

## 2022-09-07 DIAGNOSIS — I25710 Atherosclerosis of autologous vein coronary artery bypass graft(s) with unstable angina pectoris: Secondary | ICD-10-CM | POA: Diagnosis not present

## 2022-09-09 ENCOUNTER — Other Ambulatory Visit (HOSPITAL_COMMUNITY): Payer: Self-pay

## 2022-09-10 ENCOUNTER — Other Ambulatory Visit (HOSPITAL_COMMUNITY): Payer: Self-pay

## 2022-09-10 MED ORDER — CITALOPRAM HYDROBROMIDE 20 MG PO TABS
20.0000 mg | ORAL_TABLET | Freq: Every day | ORAL | 2 refills | Status: DC
  Filled 2022-09-10: qty 90, 90d supply, fill #0
  Filled 2022-12-07: qty 90, 90d supply, fill #1
  Filled 2023-03-04: qty 90, 90d supply, fill #2

## 2022-09-10 MED ORDER — METOPROLOL TARTRATE 25 MG PO TABS
12.5000 mg | ORAL_TABLET | Freq: Two times a day (BID) | ORAL | 3 refills | Status: DC
  Filled 2022-09-10: qty 90, 90d supply, fill #0
  Filled 2022-12-07: qty 90, 90d supply, fill #1
  Filled 2023-03-04: qty 90, 90d supply, fill #2
  Filled 2023-06-07 – 2023-06-08 (×2): qty 90, 90d supply, fill #3

## 2022-10-05 ENCOUNTER — Other Ambulatory Visit (HOSPITAL_COMMUNITY): Payer: Self-pay

## 2022-10-25 ENCOUNTER — Other Ambulatory Visit (HOSPITAL_COMMUNITY): Payer: Self-pay

## 2022-12-04 ENCOUNTER — Emergency Department
Admission: EM | Admit: 2022-12-04 | Discharge: 2022-12-04 | Disposition: A | Discharge status: Home / Self Care | Payer: Commercial Managed Care - PPO | Attending: Emergency Medicine | Admitting: Emergency Medicine

## 2022-12-04 ENCOUNTER — Other Ambulatory Visit: Payer: Self-pay

## 2022-12-04 ENCOUNTER — Emergency Department: Payer: Commercial Managed Care - PPO

## 2022-12-04 DIAGNOSIS — Y92410 Unspecified street and highway as the place of occurrence of the external cause: Secondary | ICD-10-CM | POA: Diagnosis not present

## 2022-12-04 DIAGNOSIS — T700XXA Otitic barotrauma, initial encounter: Secondary | ICD-10-CM | POA: Diagnosis not present

## 2022-12-04 DIAGNOSIS — Z041 Encounter for examination and observation following transport accident: Secondary | ICD-10-CM | POA: Diagnosis not present

## 2022-12-04 DIAGNOSIS — T701XXA Sinus barotrauma, initial encounter: Secondary | ICD-10-CM | POA: Diagnosis not present

## 2022-12-04 DIAGNOSIS — T7029XA Other effects of high altitude, initial encounter: Secondary | ICD-10-CM
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
## 2022-12-07 ENCOUNTER — Other Ambulatory Visit (HOSPITAL_COMMUNITY): Payer: Self-pay

## 2022-12-07 MED ORDER — OMEPRAZOLE 20 MG PO CPDR
20.0000 mg | DELAYED_RELEASE_CAPSULE | Freq: Every day | ORAL | 11 refills | Status: DC
  Filled 2022-12-07: qty 30, 30d supply, fill #0
  Filled 2023-01-19: qty 30, 30d supply, fill #1

## 2022-12-10 DIAGNOSIS — H938X2 Other specified disorders of left ear: Secondary | ICD-10-CM | POA: Diagnosis not present

## 2022-12-10 DIAGNOSIS — H9192 Unspecified hearing loss, left ear: Secondary | ICD-10-CM | POA: Diagnosis not present

## 2022-12-13 ENCOUNTER — Ambulatory Visit: Payer: Commercial Managed Care - PPO | Admitting: Dermatology

## 2022-12-13 DIAGNOSIS — Z85820 Personal history of malignant melanoma of skin: Secondary | ICD-10-CM | POA: Diagnosis not present

## 2022-12-13 DIAGNOSIS — Z86018 Personal history of other benign neoplasm: Secondary | ICD-10-CM

## 2022-12-13 DIAGNOSIS — D229 Melanocytic nevi, unspecified: Secondary | ICD-10-CM | POA: Diagnosis not present

## 2022-12-13 DIAGNOSIS — L821 Other seborrheic keratosis: Secondary | ICD-10-CM

## 2022-12-13 DIAGNOSIS — L578 Other skin changes due to chronic exposure to nonionizing radiation: Secondary | ICD-10-CM

## 2022-12-13 DIAGNOSIS — L814 Other melanin hyperpigmentation: Secondary | ICD-10-CM | POA: Diagnosis not present

## 2022-12-13 DIAGNOSIS — Z1283 Encounter for screening for malignant neoplasm of skin: Secondary | ICD-10-CM

## 2022-12-13 NOTE — Progress Notes (Signed)
   Follow-Up Visit   Subjective  Carlos Blair is a 67 y.o. male who presents for the following: Annual Exam (Hx MM, dysplastic nevi ). The patient presents for Total-Body Skin Exam (TBSE) for skin cancer screening and mole check.  The patient has spots, moles and lesions to be evaluated, some may be new or changing and the patient has concerns that these could be cancer.  The following portions of the chart were reviewed this encounter and updated as appropriate:   Tobacco  Allergies  Meds  Problems  Med Hx  Surg Hx  Fam Hx     Review of Systems:  No other skin or systemic complaints except as noted in HPI or Assessment and Plan.  Objective  Well appearing patient in no apparent distress; mood and affect are within normal limits.  A full examination was performed including scalp, head, eyes, ears, nose, lips, neck, chest, axillae, abdomen, back, buttocks, bilateral upper extremities, bilateral lower extremities, hands, feet, fingers, toes, fingernails, and toenails. All findings within normal limits unless otherwise noted below.   Assessment & Plan   Lentigines - Scattered tan macules - Due to sun exposure - Benign-appearing, observe - Recommend daily broad spectrum sunscreen SPF 30+ to sun-exposed areas, reapply every 2 hours as needed. - Call for any changes  Seborrheic Keratoses - Stuck-on, waxy, tan-brown papules and/or plaques  - Benign-appearing - Discussed benign etiology and prognosis. - Observe - Call for any changes  Melanocytic Nevi - Tan-brown and/or pink-flesh-colored symmetric macules and papules - Benign appearing on exam today - Observation - Call clinic for new or changing moles - Recommend daily use of broad spectrum spf 30+ sunscreen to sun-exposed areas.   Hemangiomas - Red papules - Discussed benign nature - Observe - Call for any changes  Actinic Damage - Chronic condition, secondary to cumulative UV/sun exposure - diffuse scaly  erythematous macules with underlying dyspigmentation - Recommend daily broad spectrum sunscreen SPF 30+ to sun-exposed areas, reapply every 2 hours as needed.  - Staying in the shade or wearing long sleeves, sun glasses (UVA+UVB protection) and wide brim hats (4-inch brim around the entire circumference of the hat) are also recommended for sun protection.  - Call for new or changing lesions.  History of Melanoma - No evidence of recurrence today - No lymphadenopathy - Recommend regular full body skin exams - Recommend daily broad spectrum sunscreen SPF 30+ to sun-exposed areas, reapply every 2 hours as needed.  - Call if any new or changing lesions are noted between office visits  History of Dysplastic Nevi - No evidence of recurrence today - Recommend regular full body skin exams - Recommend daily broad spectrum sunscreen SPF 30+ to sun-exposed areas, reapply every 2 hours as needed.  - Call if any new or changing lesions are noted between office visits  Skin cancer screening performed today.  Return in about 6 months (around 06/13/2023) for TBSE.  Luther Redo, CMA, am acting as scribe for Sarina Ser, MD . Documentation: I have reviewed the above documentation for accuracy and completeness, and I agree with the above.  Sarina Ser, MD

## 2022-12-13 NOTE — Patient Instructions (Signed)
Due to recent changes in healthcare laws, you may see results of your pathology and/or laboratory studies on MyChart before the doctors have had a chance to review them. We understand that in some cases there may be results that are confusing or concerning to you. Please understand that not all results are received at the same time and often the doctors may need to interpret multiple results in order to provide you with the best plan of care or course of treatment. Therefore, we ask that you please give us 2 business days to thoroughly review all your results before contacting the office for clarification. Should we see a critical lab result, you will be contacted sooner.   If You Need Anything After Your Visit  If you have any questions or concerns for your doctor, please call our main line at [phone] and press option 4 to reach your doctor's medical assistant. If no one answers, please leave a voicemail as directed and we will return your call as soon as possible. Messages left after 4 pm will be answered the following business day.   You may also send us a message via MyChart. We typically respond to MyChart messages within 1-2 business days.  For prescription refills, please ask your pharmacy to contact our office. Our fax number is [phone].  If you have an urgent issue when the clinic is closed that cannot wait until the next business day, you can page your doctor at the number below.    Please note that while we do our best to be available for urgent issues outside of office hours, we are not available 24/7.   If you have an urgent issue and are unable to reach us, you may choose to seek medical care at your doctor's office, retail clinic, urgent care center, or emergency room.  If you have a medical emergency, please immediately call 911 or go to the emergency department.  Pager Numbers  - Dr. Kowalski: [phone]  - Dr. Moye: [phone]  - Dr. Stewart:  [phone]  In the event of inclement weather, please call our main line at [phone] for an update on the status of any delays or closures.  Dermatology Medication Tips: Please keep the boxes that topical medications come in in order to help keep track of the instructions about where and how to use these. Pharmacies typically print the medication instructions only on the boxes and not directly on the medication tubes.   If your medication is too expensive, please contact our office at [phone] option 4 or send us a message through MyChart.   We are unable to tell what your co-pay for medications will be in advance as this is different depending on your insurance coverage. However, we may be able to find a substitute medication at lower cost or fill out paperwork to get insurance to cover a needed medication.   If a prior authorization is required to get your medication covered by your insurance company, please allow us 1-2 business days to complete this process.  Drug prices often vary depending on where the prescription is filled and some pharmacies may offer cheaper prices.  The website www.goodrx.com contains coupons for medications through different pharmacies. The prices here do not account for what the cost may be with help from insurance (it may be cheaper with your insurance), but the website can give you the price if you did not use any insurance.  - You can print the associated coupon and take it with   your prescription to the pharmacy.  - You may also stop by our office during regular business hours and pick up a GoodRx coupon card.  - If you need your prescription sent electronically to a different pharmacy, notify our office through [facility] MyChart or by phone at [phone] option 4.     Si Usted Necesita Algo Despus de Su Visita  Tambin puede enviarnos un mensaje a travs de MyChart. Por lo general respondemos a los mensajes de MyChart en el transcurso de 1 a 2  das hbiles.  Para renovar recetas, por favor pida a su farmacia que se ponga en contacto con nuestra oficina. Nuestro nmero de fax es el [phone].  Si tiene un asunto urgente cuando la clnica est cerrada y que no puede esperar hasta el siguiente da hbil, puede llamar/localizar a su doctor(a) al nmero que aparece a continuacin.   Por favor, tenga en cuenta que aunque hacemos todo lo posible para estar disponibles para asuntos urgentes fuera del horario de oficina, no estamos disponibles las 24 horas del da, los 7 das de la semana.   Si tiene un problema urgente y no puede comunicarse con nosotros, puede optar por buscar atencin mdica  en el consultorio de su doctor(a), en una clnica privada, en un centro de atencin urgente o en una sala de emergencias.  Si tiene una emergencia mdica, por favor llame inmediatamente al 911 o vaya a la sala de emergencias.  Nmeros de bper  - Dr. Kowalski: [phone]  - Dra. Moye: [phone]  - Dra. Stewart: [phone]  En caso de inclemencias del tiempo, por favor llame a nuestra lnea principal al [phone] para una actualizacin sobre el estado de cualquier retraso o cierre.  Consejos para la medicacin en dermatologa: Por favor, guarde las cajas en las que vienen los medicamentos de uso tpico para ayudarle a seguir las instrucciones sobre dnde y cmo usarlos. Las farmacias generalmente imprimen las instrucciones del medicamento slo en las cajas y no directamente en los tubos del medicamento.   Si su medicamento es muy caro, por favor, pngase en contacto con nuestra oficina llamando al [phone] y presione la opcin 4 o envenos un mensaje a travs de MyChart.   No podemos decirle cul ser su copago por los medicamentos por adelantado ya que esto es diferente dependiendo de la cobertura de su seguro. Sin embargo, es posible que podamos encontrar un medicamento sustituto a menor costo o llenar un formulario para que el  seguro cubra el medicamento que se considera necesario.   Si se requiere una autorizacin previa para que su compaa de seguros cubra su medicamento, por favor permtanos de 1 a 2 das hbiles para completar este proceso.  Los precios de los medicamentos varan con frecuencia dependiendo del lugar de dnde se surte la receta y alguna farmacias pueden ofrecer precios ms baratos.  El sitio web www.goodrx.com tiene cupones para medicamentos de diferentes farmacias. Los precios aqu no tienen en cuenta lo que podra costar con la ayuda del seguro (puede ser ms barato con su seguro), pero el sitio web puede darle el precio si no utiliz ningn seguro.  - Puede imprimir el cupn correspondiente y llevarlo con su receta a la farmacia.  - Tambin puede pasar por nuestra oficina durante el horario de atencin regular y recoger una tarjeta de cupones de GoodRx.  - Si necesita que su receta se enve electrnicamente a una farmacia diferente, informe a nuestra oficina a travs de MyChart de    o por telfono llamando al [phone] y presione la opcin 4.  

## 2022-12-17 ENCOUNTER — Encounter: Payer: Self-pay | Admitting: Dermatology

## 2023-01-07 ENCOUNTER — Other Ambulatory Visit (HOSPITAL_COMMUNITY): Payer: Self-pay

## 2023-01-07 DIAGNOSIS — H90A22 Sensorineural hearing loss, unilateral, left ear, with restricted hearing on the contralateral side: Secondary | ICD-10-CM | POA: Diagnosis not present

## 2023-01-07 DIAGNOSIS — H6983 Other specified disorders of Eustachian tube, bilateral: Secondary | ICD-10-CM | POA: Diagnosis not present

## 2023-01-07 DIAGNOSIS — H903 Sensorineural hearing loss, bilateral: Secondary | ICD-10-CM | POA: Diagnosis not present

## 2023-01-07 MED ORDER — PREDNISONE 10 MG (21) PO TBPK
ORAL_TABLET | ORAL | 0 refills | Status: DC
  Filled 2023-01-07: qty 21, 6d supply, fill #0

## 2023-01-07 MED ORDER — FLUTICASONE PROPIONATE 50 MCG/ACT NA SUSP
2.0000 | Freq: Every day | NASAL | 12 refills | Status: AC
  Filled 2023-01-07: qty 16, 30d supply, fill #0

## 2023-01-26 ENCOUNTER — Other Ambulatory Visit (HOSPITAL_COMMUNITY): Payer: Self-pay

## 2023-01-26 MED ORDER — ROSUVASTATIN CALCIUM 20 MG PO TABS
20.0000 mg | ORAL_TABLET | Freq: Every day | ORAL | 11 refills | Status: DC
  Filled 2023-01-26: qty 90, 90d supply, fill #0

## 2023-02-03 ENCOUNTER — Other Ambulatory Visit (HOSPITAL_COMMUNITY): Payer: Self-pay

## 2023-02-03 MED ORDER — OMEGA-3-ACID ETHYL ESTERS 1 G PO CAPS
2.0000 | ORAL_CAPSULE | Freq: Two times a day (BID) | ORAL | 11 refills | Status: DC
  Filled 2023-02-03: qty 120, 30d supply, fill #0

## 2023-02-09 DIAGNOSIS — Z0000 Encounter for general adult medical examination without abnormal findings: Secondary | ICD-10-CM | POA: Diagnosis not present

## 2023-02-10 ENCOUNTER — Ambulatory Visit: Payer: Commercial Managed Care - PPO | Attending: Cardiovascular Disease | Admitting: Cardiovascular Disease

## 2023-02-10 ENCOUNTER — Other Ambulatory Visit (HOSPITAL_COMMUNITY): Payer: Self-pay

## 2023-02-10 ENCOUNTER — Encounter: Payer: Self-pay | Admitting: Cardiovascular Disease

## 2023-02-10 VITALS — BP 138/70 | HR 73 | Ht 67.5 in | Wt 174.1 lb

## 2023-02-10 DIAGNOSIS — E785 Hyperlipidemia, unspecified: Secondary | ICD-10-CM | POA: Diagnosis not present

## 2023-02-10 DIAGNOSIS — I2510 Atherosclerotic heart disease of native coronary artery without angina pectoris: Secondary | ICD-10-CM

## 2023-02-10 MED ORDER — ROSUVASTATIN CALCIUM 40 MG PO TABS
40.0000 mg | ORAL_TABLET | Freq: Every day | ORAL | 1 refills | Status: DC
  Filled 2023-02-10: qty 90, 90d supply, fill #0
  Filled 2023-07-22 (×2): qty 90, 90d supply, fill #1

## 2023-02-10 MED ORDER — ASPIRIN 81 MG PO TBEC: 81.0000 mg | DELAYED_RELEASE_TABLET | Freq: Every day | ORAL | Status: AC

## 2023-02-10 NOTE — Patient Instructions (Addendum)
Medication Instructions:  DECREASE the Aspirin to 81 mg once daily  INCREASE the Rosuvastatin to 40 mg once dailly  *If you need a refill on your cardiac medications before your next appointment, please call your pharmacy*   Lab Work: None ordered If you have labs (blood work) drawn today and your tests are completely normal, you will receive your results only by: [place] (if you have MyChart) OR A paper copy in the mail If you have any lab test that is abnormal or we need to change your treatment, we will call you to review the results.   Testing/Procedures: None ordered   Follow-Up: At [facility], you and your health needs are our priority.  As part of our continuing mission to provide you with exceptional heart care, we have created designated Provider Care Teams.  These Care Teams include your primary Cardiologist (physician) and Advanced Practice Providers (APPs -  Physician Assistants and Nurse Practitioners) who all work together to provide you with the care you need, when you need it.  We recommend signing up for the patient portal called "MyChart".  Sign up information is provided on this After Visit Summary.  MyChart is used to connect with patients for Virtual Visits (Telemedicine).  Patients are able to view lab/test results, encounter notes, upcoming appointments, etc.  Non-urgent messages can be sent to your provider as well.   To learn more about what you can do with MyChart, go to [email or web address].    Your next appointment:   6 month(s)  Provider:   You may see Dr. Fletcher Anon or one of the following Advanced Practice Providers on your designated Care Team:   Murray Hodgkins, NP Christell Faith, PA-C Cadence Kathlen Mody, PA-C Gerrie Nordmann, NP

## 2023-02-10 NOTE — Progress Notes (Signed)
Cardiology Office Note   Date:  02/10/2023   ID:  Carlos Blair, DOB 1956-05-17, MRN [id number]  PCP:  Maryland Pink, MD  Cardiologist:   Kathlyn Sacramento, MD   Chief Complaint  Patient presents with   New Patient (Initial Visit)    Ref by Dr. Ubaldo Glassing to establish care for CAD/s/p CABG x 3. Medications reviewed by the patient verbally. "Doing well."       History of Present Illness: Carlos Blair is a 67 y.o. male who presents to establish cardiovascular care regarding coronary artery disease.  He was previously followed by [facility] cardiology. He has known history of coronary artery disease status post CABG at the age of 67 years old at [facility] after he had myocardial infarction.  The surgery was done by Dr. Roxan Hockey.  Other medical problems include hyperlipidemia.  He has strong family history of premature coronary artery disease.  He is a lifelong non-smoker and does not drink alcohol.  He is a Environmental education officer. He has done very well since his CABG with no recurrent cardiac events.  He had stress test throughout the years that have been normal.  He exercises on a regular basis with no limitations.  He takes his medications regularly.  He denies any chest pain, shortness of breath or palpitations. His wife works as an Therapist, sports in the [place] at [employer]. Most recent nuclear stress test in September 2022 showed no evidence of ischemia with normal ejection fraction.  He was able to exercise for 12 minutes.  Past Medical History:  Diagnosis Date   Actinic keratosis    Atypical nevus 11/24/2015   left sup lat pectoral. AMP. Early evolving stage of LM (early MIS) cannot be excluded. Excised 12/16/2015, margins free.   Coronary artery disease    Dysplastic nevus 04/15/2008   right scapula   Dysplastic nevus 10/16/2014   left mid lat volar forearm   Elevated lipids    GERD (gastroesophageal reflux disease)    Hypertension    Malignant melanoma ([place]) 06/04/2009   Level III to IV, 0.81 mm,  superficial spreading, L lateral knee   Myocardial infarction ([place])    x2 3vessel blockage 100%    Past Surgical History:  Procedure Laterality Date   CORONARY ARTERY BYPASS GRAFT     DORSAL COMPARTMENT RELEASE Right 05/06/2016   Procedure: RELEASE DORSAL COMPARTMENT (DEQUERVAIN);  Surgeon: Hessie Knows, MD;  Location: [facility] ORS;  Service: Orthopedics;  Laterality: Right;   MELANOMA EXCISION Left    knee   MICROLARYNGOSCOPY WITH CO2 LASER AND EXCISION OF VOCAL CORD LESION       Current Outpatient Medications  Medication Sig Dispense Refill   aspirin 325 MG EC tablet Take 325 mg by mouth daily. Enteric coated     cholecalciferol (VITAMIN D) 1000 units tablet Take 1,000 Units by mouth daily.     citalopram (CELEXA) 20 MG tablet Take 1 tablet (20 mg total) by mouth daily. 90 tablet 2   finasteride (PROPECIA) 1 MG tablet Take 1 tablet (1 mg total) by mouth daily. 90 tablet 3   fluticasone (FLONASE) 50 MCG/ACT nasal spray      fluticasone (FLONASE) 50 MCG/ACT nasal spray Place 2 sprays into both nostrils once daily. 16 g 12   ketoconazole (NIZORAL) 2 % cream Apply 1 application topically to feet at bedtime. 60 g 11   metoprolol tartrate (LOPRESSOR) 25 MG tablet Take 0.5 tablets (12.5 mg total) by mouth 2 (two) times daily. 90 tablet  3   mometasone (ELOCON) 0.1 % cream Apply 1 application topically to affected areas of psoriasis on th elbows 1-2 times daily as needed up to 5 days a week 50 g 5   omega-3 acid ethyl esters (LOVAZA) 1 g capsule TAKE 2 CAPSULES BY MOUTH TWICE A DAY 120 capsule 11   omeprazole (PRILOSEC) 20 MG capsule TAKE 1 CAPSULE (20 MG TOTAL) BY MOUTH ONCE DAILY 30 capsule 11   rosuvastatin (CRESTOR) 20 MG tablet Take 1 tablet (20 mg total) by mouth daily. 30 tablet 11   Tavaborole 5 % SOLN Apply to toenails QHS 5d/wk. 10 mL 11   triamcinolone cream (KENALOG) 0.1 % Apply 1 application topically daily. Qd up to 5 days per week until clear to leg for poison ivy, avoid face,  groin, axilla 30 g 1   vardenafil (LEVITRA) 20 MG tablet TAKE 1 TABLET BY MOUTH ONCE DAILY AS NEEDED FOR ERECTILE DYSFUNCTION. 6 tablet 5   HYDROcodone-acetaminophen (NORCO) 5-325 MG tablet Take 1 tablet by mouth every 6 (six) hours as needed for moderate pain. (Patient not taking: Reported on 09/30/2021) 15 tablet 0   No current facility-administered medications for this visit.    Allergies:   Patient has no known allergies.    Social History:  The patient  reports that he has never smoked. He does not have any smokeless tobacco history on file. He reports that he does not drink alcohol and does not use drugs.   Family History:  The patient's family history includes Dementia in his father; Diabetes in his sister; Heart attack in his maternal grandmother, paternal grandfather, and paternal grandmother; Heart attack (age of onset: 61) in his maternal grandfather; Heart disease in his maternal grandfather; Heart disease (age of onset: 35) in his father; Hypertension in his mother.    ROS:  Please see the history of present illness.   Otherwise, review of systems are positive for none.   All other systems are reviewed and negative.    PHYSICAL EXAM: VS:  BP 138/70 (BP Location: Right Arm, Patient Position: Sitting, Cuff Size: Normal)   Pulse 73   Ht 5' 7.5" (1.715 m)   Wt 174 lb 2 oz (79 kg)   SpO2 97%   BMI 26.87 kg/m  , BMI Body mass index is 26.87 kg/m. GEN: Well nourished, well developed, in no acute distress  HEENT: normal  Neck: no JVD, carotid bruits, or masses Cardiac: RRR; no murmurs, rubs, or gallops,no edema  Respiratory:  clear to auscultation bilaterally, normal work of breathing GI: soft, nontender, nondistended, + BS MS: no deformity or atrophy  Skin: warm and dry, no rash Neuro:  Strength and sensation are intact Psych: euthymic mood, full affect   EKG:  EKG is ordered today. The ekg ordered today demonstrates normal sinus rhythm with no significant ST or T wave  changes.   Recent Labs: No results found for requested labs within last 365 days.    Lipid Panel No results found for: "CHOL", "TRIG", "HDL", "CHOLHDL", "VLDL", "LDLCALC", "LDLDIRECT"    Wt Readings from Last 3 Encounters:  02/10/23 174 lb 2 oz (79 kg)  12/04/22 173 lb (78.5 kg)  05/06/16 168 lb (76.2 kg)          02/10/2023    9:51 AM  PAD Screen  Previous PAD dx? No  Previous surgical procedure? No  Pain with walking? No  Feet/toe relief with dangling? No  Painful, non-healing ulcers? No  Extremities discolored? No  ASSESSMENT AND PLAN:  1.  Coronary artery disease involving native coronary arteries without angina: He is status post remote CABG. no anginal symptoms.  I asked him to decrease aspirin to 81 mg once daily.  2.  Hyperlipidemia: Most recent lipid profile in July showed an LDL of 85 and triglyceride of 94.  Given his coronary artery disease, I recommended target LDL of less than 70.  Thus, I increased rosuvastatin to 40 mg once daily.    Disposition:   FU with me in 6 months  Signed,  Kathlyn Sacramento, MD  02/10/2023 10:05 AM    [place]

## 2023-02-16 DIAGNOSIS — Z0000 Encounter for general adult medical examination without abnormal findings: Secondary | ICD-10-CM | POA: Diagnosis not present

## 2023-02-16 DIAGNOSIS — I25710 Atherosclerosis of autologous vein coronary artery bypass graft(s) with unstable angina pectoris: Secondary | ICD-10-CM | POA: Diagnosis not present

## 2023-02-16 DIAGNOSIS — F419 Anxiety disorder, unspecified: Secondary | ICD-10-CM | POA: Diagnosis not present

## 2023-02-16 DIAGNOSIS — I10 Essential (primary) hypertension: Secondary | ICD-10-CM | POA: Diagnosis not present

## 2023-02-16 DIAGNOSIS — F32A Depression, unspecified: Secondary | ICD-10-CM | POA: Diagnosis not present

## 2023-02-16 DIAGNOSIS — E782 Mixed hyperlipidemia: Secondary | ICD-10-CM | POA: Diagnosis not present

## 2023-02-18 ENCOUNTER — Other Ambulatory Visit (HOSPITAL_COMMUNITY): Payer: Self-pay

## 2023-02-28 ENCOUNTER — Other Ambulatory Visit (HOSPITAL_COMMUNITY): Payer: Self-pay

## 2023-02-28 MED ORDER — FINASTERIDE 1 MG PO TABS
1.0000 mg | ORAL_TABLET | Freq: Every day | ORAL | 3 refills | Status: DC
  Filled 2023-02-28: qty 90, 90d supply, fill #0
  Filled 2023-06-20: qty 90, 90d supply, fill #1

## 2023-03-03 ENCOUNTER — Other Ambulatory Visit: Payer: Self-pay

## 2023-04-05 ENCOUNTER — Other Ambulatory Visit (HOSPITAL_COMMUNITY): Payer: Self-pay

## 2023-04-05 MED ORDER — OMEGA-3-ACID ETHYL ESTERS 1 G PO CAPS
2.0000 | ORAL_CAPSULE | Freq: Two times a day (BID) | ORAL | 11 refills | Status: AC
  Filled 2023-04-05: qty 120, 30d supply, fill #0
  Filled 2023-06-07 – 2023-06-08 (×2): qty 120, 30d supply, fill #1

## 2023-04-07 DIAGNOSIS — H90A22 Sensorineural hearing loss, unilateral, left ear, with restricted hearing on the contralateral side: Secondary | ICD-10-CM | POA: Diagnosis not present

## 2023-04-07 DIAGNOSIS — H6983 Other specified disorders of Eustachian tube, bilateral: Secondary | ICD-10-CM | POA: Diagnosis not present

## 2023-04-15 ENCOUNTER — Other Ambulatory Visit (HOSPITAL_COMMUNITY): Payer: Self-pay

## 2023-05-26 ENCOUNTER — Ambulatory Visit: Payer: Commercial Managed Care - PPO | Admitting: Dermatology

## 2023-05-26 ENCOUNTER — Encounter: Payer: Self-pay | Admitting: Dermatology

## 2023-05-26 VITALS — BP 125/83 | HR 68

## 2023-05-26 DIAGNOSIS — Z86018 Personal history of other benign neoplasm: Secondary | ICD-10-CM

## 2023-05-26 DIAGNOSIS — L578 Other skin changes due to chronic exposure to nonionizing radiation: Secondary | ICD-10-CM

## 2023-05-26 DIAGNOSIS — W908XXA Exposure to other nonionizing radiation, initial encounter: Secondary | ICD-10-CM

## 2023-05-26 DIAGNOSIS — Z79899 Other long term (current) drug therapy: Secondary | ICD-10-CM

## 2023-05-26 DIAGNOSIS — B351 Tinea unguium: Secondary | ICD-10-CM | POA: Diagnosis not present

## 2023-05-26 DIAGNOSIS — L820 Inflamed seborrheic keratosis: Secondary | ICD-10-CM

## 2023-05-26 DIAGNOSIS — L814 Other melanin hyperpigmentation: Secondary | ICD-10-CM

## 2023-05-26 DIAGNOSIS — D229 Melanocytic nevi, unspecified: Secondary | ICD-10-CM

## 2023-05-26 DIAGNOSIS — D1801 Hemangioma of skin and subcutaneous tissue: Secondary | ICD-10-CM

## 2023-05-26 DIAGNOSIS — L409 Psoriasis, unspecified: Secondary | ICD-10-CM

## 2023-05-26 DIAGNOSIS — B353 Tinea pedis: Secondary | ICD-10-CM | POA: Diagnosis not present

## 2023-05-26 DIAGNOSIS — Z1283 Encounter for screening for malignant neoplasm of skin: Secondary | ICD-10-CM

## 2023-05-26 DIAGNOSIS — Z85820 Personal history of malignant melanoma of skin: Secondary | ICD-10-CM

## 2023-05-26 DIAGNOSIS — L821 Other seborrheic keratosis: Secondary | ICD-10-CM | POA: Diagnosis not present

## 2023-05-26 DIAGNOSIS — L738 Other specified follicular disorders: Secondary | ICD-10-CM

## 2023-05-26 NOTE — Patient Instructions (Addendum)
Seborrheic Keratosis  What causes seborrheic keratoses? Seborrheic keratoses are harmless, common skin growths that first appear during adult life.  As time goes by, more growths appear.  Some people may develop a large number of them.  Seborrheic keratoses appear on both covered and uncovered body parts.  They are not caused by sunlight.  The tendency to develop seborrheic keratoses can be inherited.  They vary in color from skin-colored to gray, brown, or even black.  They can be either smooth or have a rough, warty surface.   Seborrheic keratoses are superficial and look as if they were stuck on the skin.  Under the microscope this type of keratosis looks like layers upon layers of skin.  That is why at times the top layer may seem to fall off, but the rest of the growth remains and re-grows.    Treatment Seborrheic keratoses do not need to be treated, but can easily be removed in the office.  Seborrheic keratoses often cause symptoms when they rub on clothing or jewelry.  Lesions can be in the way of shaving.  If they become inflamed, they can cause itching, soreness, or burning.  Removal of a seborrheic keratosis can be accomplished by freezing, burning, or surgery. If any spot bleeds, scabs, or grows rapidly, please return to have it checked, as these can be an indication of a skin cancer.  Cryotherapy Aftercare  Wash gently with soap and water everyday.   Apply Vaseline and Band-Aid daily until healed.    Melanoma ABCDEs  Melanoma is the most dangerous type of skin cancer, and is the leading cause of death from skin disease.  You are more likely to develop melanoma if you: Have light-colored skin, light-colored eyes, or red or blond hair Spend a lot of time in the sun Tan regularly, either outdoors or in a tanning bed Have had blistering sunburns, especially during childhood Have a close family member who has had a melanoma Have atypical moles or large birthmarks  Early detection of  melanoma is key since treatment is typically straightforward and cure rates are extremely high if we catch it early.   The first sign of melanoma is often a change in a mole or a new dark spot.  The ABCDE system is a way of remembering the signs of melanoma.  A for asymmetry:  The two halves do not match. B for border:  The edges of the growth are irregular. C for color:  A mixture of colors are present instead of an even brown color. D for diameter:  Melanomas are usually (but not always) greater than 6mm - the size of a pencil eraser. E for evolution:  The spot keeps changing in size, shape, and color.  Please check your skin once per month between visits. You can use a small mirror in front and a large mirror behind you to keep an eye on the back side or your body.   If you see any new or changing lesions before your next follow-up, please call to schedule a visit.  Please continue daily skin protection including broad spectrum sunscreen SPF 30+ to sun-exposed areas, reapplying every 2 hours as needed when you're outdoors.   Staying in the shade or wearing long sleeves, sun glasses (UVA+UVB protection) and wide brim hats (4-inch brim around the entire circumference of the hat) are also recommended for sun protection.    Due to recent changes in healthcare laws, you may see results of your pathology and/or laboratory   studies on MyChart before the doctors have had a chance to review them. We understand that in some cases there may be results that are confusing or concerning to you. Please understand that not all results are received at the same time and often the doctors may need to interpret multiple results in order to provide you with the best plan of care or course of treatment. Therefore, we ask that you please give us 2 business days to thoroughly review all your results before contacting the office for clarification. Should we see a critical lab result, you will be contacted sooner.   If  You Need Anything After Your Visit  If you have any questions or concerns for your doctor, please call our main line at [phone] and press option 4 to reach your doctor's medical assistant. If no one answers, please leave a voicemail as directed and we will return your call as soon as possible. Messages left after 4 pm will be answered the following business day.   You may also send us a message via MyChart. We typically respond to MyChart messages within 1-2 business days.  For prescription refills, please ask your pharmacy to contact our office. Our fax number is [phone].  If you have an urgent issue when the clinic is closed that cannot wait until the next business day, you can page your doctor at the number below.    Please note that while we do our best to be available for urgent issues outside of office hours, we are not available 24/7.   If you have an urgent issue and are unable to reach us, you may choose to seek medical care at your doctor's office, retail clinic, urgent care center, or emergency room.  If you have a medical emergency, please immediately call 911 or go to the emergency department.  Pager Numbers  - Dr. Kowalski: [phone]  - Dr. Moye: [phone]  - Dr. Stewart: [phone]  In the event of inclement weather, please call our main line at [phone] for an update on the status of any delays or closures.  Dermatology Medication Tips: Please keep the boxes that topical medications come in in order to help keep track of the instructions about where and how to use these. Pharmacies typically print the medication instructions only on the boxes and not directly on the medication tubes.   If your medication is too expensive, please contact our office at [phone] option 4 or send us a message through MyChart.   We are unable to tell what your co-pay for medications will be in advance as this is different depending on your insurance coverage.  However, we may be able to find a substitute medication at lower cost or fill out paperwork to get insurance to cover a needed medication.   If a prior authorization is required to get your medication covered by your insurance company, please allow us 1-2 business days to complete this process.  Drug prices often vary depending on where the prescription is filled and some pharmacies may offer cheaper prices.  The website www.goodrx.com contains coupons for medications through different pharmacies. The prices here do not account for what the cost may be with help from insurance (it may be cheaper with your insurance), but the website can give you the price if you did not use any insurance.  - You can print the associated coupon and take it with your prescription to the pharmacy.  - You may also stop by our office during   regular business hours and pick up a GoodRx coupon card.  - If you need your prescription sent electronically to a different pharmacy, notify our office through [facility] MyChart or by phone at [phone] option 4.     Si Usted Necesita Algo Despus de Su Visita  Tambin puede enviarnos un mensaje a travs de MyChart. Por lo general respondemos a los mensajes de MyChart en el transcurso de 1 a 2 das hbiles.  Para renovar recetas, por favor pida a su farmacia que se ponga en contacto con nuestra oficina. Nuestro nmero de fax es el [phone].  Si tiene un asunto urgente cuando la clnica est cerrada y que no puede esperar hasta el siguiente da hbil, puede llamar/localizar a su doctor(a) al nmero que aparece a continuacin.   Por favor, tenga en cuenta que aunque hacemos todo lo posible para estar disponibles para asuntos urgentes fuera del horario de oficina, no estamos disponibles las 24 horas del da, los 7 das de la semana.   Si tiene un problema urgente y no puede comunicarse con nosotros, puede optar por buscar atencin mdica  en el consultorio de su  doctor(a), en una clnica privada, en un centro de atencin urgente o en una sala de emergencias.  Si tiene una emergencia mdica, por favor llame inmediatamente al 911 o vaya a la sala de emergencias.  Nmeros de bper  - Dr. Kowalski: [phone]  - Dra. Moye: [phone]  - Dra. Stewart: [phone]  En caso de inclemencias del tiempo, por favor llame a nuestra lnea principal al [phone] para una actualizacin sobre el estado de cualquier retraso o cierre.  Consejos para la medicacin en dermatologa: Por favor, guarde las cajas en las que vienen los medicamentos de uso tpico para ayudarle a seguir las instrucciones sobre dnde y cmo usarlos. Las farmacias generalmente imprimen las instrucciones del medicamento slo en las cajas y no directamente en los tubos del medicamento.   Si su medicamento es muy caro, por favor, pngase en contacto con nuestra oficina llamando al [phone] y presione la opcin 4 o envenos un mensaje a travs de MyChart.   No podemos decirle cul ser su copago por los medicamentos por adelantado ya que esto es diferente dependiendo de la cobertura de su seguro. Sin embargo, es posible que podamos encontrar un medicamento sustituto a menor costo o llenar un formulario para que el seguro cubra el medicamento que se considera necesario.   Si se requiere una autorizacin previa para que su compaa de seguros cubra su medicamento, por favor permtanos de 1 a 2 das hbiles para completar este proceso.  Los precios de los medicamentos varan con frecuencia dependiendo del lugar de dnde se surte la receta y alguna farmacias pueden ofrecer precios ms baratos.  El sitio web www.goodrx.com tiene cupones para medicamentos de diferentes farmacias. Los precios aqu no tienen en cuenta lo que podra costar con la ayuda del seguro (puede ser ms barato con su seguro), pero el sitio web puede darle el precio si no utiliz ningn seguro.  - Puede imprimir el cupn  correspondiente y llevarlo con su receta a la farmacia.  - Tambin puede pasar por nuestra oficina durante el horario de atencin regular y recoger una tarjeta de cupones de GoodRx.  - Si necesita que su receta se enve electrnicamente a una farmacia diferente, informe a nuestra oficina a travs de MyChart de [place] o por telfono llamando al [phone] y presione la opcin 4.   

## 2023-05-26 NOTE — Progress Notes (Signed)
Follow-Up Visit   Subjective  Carlos Blair is a 67 y.o. male who presents for the following: Skin Cancer Screening and Full Body Skin Exam Neck, chest, and forehead The patient presents for Total-Body Skin Exam (TBSE) for skin cancer screening and mole check. The patient has spots, moles and lesions to be evaluated, some may be new or changing and the patient may have concern these could be cancer.  The following portions of the chart were reviewed this encounter and updated as appropriate: medications, allergies, medical history  Review of Systems:  No other skin or systemic complaints except as noted in HPI or Assessment and Plan.  Objective  Well appearing patient in no apparent distress; mood and affect are within normal limits.  A full examination was performed including scalp, head, eyes, ears, nose, lips, neck, chest, axillae, abdomen, back, buttocks, bilateral upper extremities, bilateral lower extremities, hands, feet, fingers, toes, fingernails, and toenails. All findings within normal limits unless otherwise noted below.   Relevant physical exam findings are noted in the Assessment and Plan.  face, neck, chest x 22 (22) Erythematous stuck-on, waxy papule or plaque   Assessment & Plan   Psoriasis B/L elbow Psoriasis is a chronic non-curable, but treatable genetic/hereditary disease that may have other systemic features affecting other organ systems such as joints (Psoriatic Arthritis). It is associated with an increased risk of inflammatory bowel disease, heart disease, non-alcoholic fatty liver disease, and depression.   Start mometasone cream to aa's QD-BID up to 5d/wk. Topical steroids (such as triamcinolone, fluocinolone, fluocinonide, mometasone, clobetasol, halobetasol, betamethasone, hydrocortisone) can cause thinning and lightening of the skin if they are used for too long in the same area. Your physician has selected the right strength medicine for your problem  and area affected on the body. Please use your medication only as directed by your physician to prevent side effects.    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 Condition is symptomatic / bothersome to patient. Not to goal. Continue : Tavaborole solution QHS and  Ketoconazole 2% cream QHS.    Related Medications Tavaborole 5 % SOLN Apply to toenails QHS 5d/wk. ketoconazole (NIZORAL) 2 % cream Apply 1 application topically to feet at bedtime.  SKIN CANCER SCREENING PERFORMED TODAY.  ACTINIC DAMAGE - Chronic condition, secondary to cumulative UV/sun exposure - diffuse scaly erythematous macules with underlying dyspigmentation - Recommend daily broad spectrum sunscreen SPF 30+ to sun-exposed areas, reapply every 2 hours as needed.  - Staying in the shade or wearing long sleeves, sun glasses (UVA+UVB protection) and wide brim hats (4-inch brim around the entire circumference of the hat) are also recommended for sun protection.  - Call for new or changing lesions.  LENTIGINES, SEBORRHEIC KERATOSES, HEMANGIOMAS - Benign normal skin lesions - Benign-appearing - Call for any changes  MELANOCYTIC NEVI - Tan-brown and/or pink-flesh-colored symmetric macules and papules - Benign appearing on exam today - Observation - Call clinic for new or changing moles - Recommend daily use of broad spectrum spf 30+ sunscreen to sun-exposed areas.   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 Call for changes.  HISTORY OF DYSPLASTIC NEVUS Left mid lateral  volar forearm 2015 Right scalpula 2009 Left superior lateral pectoral 2017 No evidence of recurrence today Recommend  regular full body skin exams Recommend daily broad spectrum sunscreen SPF 30+ to sun-exposed areas, reapply every 2 hours as needed.  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  - Call if any new or changing lesions are noted between office visits  Inflamed seborrheic keratosis (22) face, neck, chest x 22  Symptomatic, irritating, patient would like treated.  Destruction of lesion - face, neck, chest x 22 (22) Complexity: simple   Destruction method: cryotherapy   Informed consent: discussed and consent obtained   Timeout:  patient name, date of birth, surgical site, and procedure verified Lesion destroyed using liquid nitrogen: Yes   Region frozen until ice ball extended beyond lesion: Yes   Outcome: patient tolerated procedure well with no complications   Post-procedure details: wound care instructions given     Return in about 6 months (around 11/26/2023) for TBSE.  IAsher Muir, CMA, am acting as scribe for Armida Sans, MD.  Documentation: I have reviewed the above documentation for accuracy and completeness, and I agree with the above.  Armida Sans, MD

## 2023-05-27 ENCOUNTER — Encounter: Payer: Self-pay | Admitting: Dermatology

## 2023-06-07 ENCOUNTER — Other Ambulatory Visit (HOSPITAL_COMMUNITY): Payer: Self-pay

## 2023-06-07 MED ORDER — CITALOPRAM HYDROBROMIDE 20 MG PO TABS
20.0000 mg | ORAL_TABLET | Freq: Every day | ORAL | 2 refills | Status: AC
  Filled 2023-06-07: qty 90, 90d supply, fill #0

## 2023-06-08 ENCOUNTER — Other Ambulatory Visit (HOSPITAL_COMMUNITY): Payer: Self-pay

## 2023-06-23 ENCOUNTER — Other Ambulatory Visit (HOSPITAL_COMMUNITY): Payer: Self-pay

## 2023-07-22 ENCOUNTER — Other Ambulatory Visit (HOSPITAL_COMMUNITY): Payer: Self-pay

## 2023-07-28 ENCOUNTER — Encounter: Payer: Self-pay | Admitting: Cardiovascular Disease

## 2023-07-28 ENCOUNTER — Ambulatory Visit: Payer: Commercial Managed Care - PPO | Attending: Cardiovascular Disease | Admitting: Cardiovascular Disease

## 2023-07-28 VITALS — BP 138/88 | HR 72 | Ht 67.5 in | Wt 176.0 lb

## 2023-07-28 DIAGNOSIS — E785 Hyperlipidemia, unspecified: Secondary | ICD-10-CM

## 2023-07-28 DIAGNOSIS — I2510 Atherosclerotic heart disease of native coronary artery without angina pectoris: Secondary | ICD-10-CM

## 2023-07-28 NOTE — Progress Notes (Signed)
Cardiology Office Note   Date:  07/28/2023   ID:  Carlos Blair, DOB 09-02-1956, MRN [id number]  PCP:  Jerl Mina, MD  Cardiologist:   Lorine Bears, MD   Chief Complaint  Patient presents with   Follow-up    6 month f/u no complaints today. Meds reviewed verbally with pt.      History of Present Illness: Carlos Blair is a 67 y.o. male who is here today for a follow-up visit regarding coronary artery disease.   He has known history of coronary artery disease status post CABG at the age of 67 years old at [facility] after he had myocardial infarction.  The surgery was done by Dr. Dorris Fetch.  Other medical problems include hyperlipidemia.  He has strong family history of premature coronary artery disease.  He is a lifelong non-smoker and does not drink alcohol.  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 He has done very well since his CABG with no recurrent cardiac events.  He had stress test throughout the years that have been normal.   Most recent nuclear stress test in September 2022 showed no evidence of ischemia with normal ejection fraction.  He was able to exercise for 12 minutes.  During last visit, I increased his dose of rosuvastatin and he has been tolerating the medication.  He has been doing well with no chest pain, shortness of breath or palpitations.  Past Medical History:  Diagnosis Date   Actinic keratosis    Atypical nevus 11/24/2015   left sup lat pectoral. AMP. Early evolving stage of LM (early MIS) cannot be excluded. Excised 12/16/2015, margins free.   Coronary artery disease    Dysplastic nevus 04/15/2008   right scapula   Dysplastic nevus 10/16/2014   left mid lat volar forearm   Elevated lipids    GERD (gastroesophageal reflux disease)    Hypertension    Malignant melanoma (HCC) 06/04/2009   Level III to IV, 0.81 mm, superficial spreading, L lateral knee   Myocardial infarction (HCC)    x2 3vessel blockage  100%    Past Surgical History:  Procedure Laterality Date   CORONARY ARTERY BYPASS GRAFT     DORSAL COMPARTMENT RELEASE Right 05/06/2016   Procedure: RELEASE DORSAL COMPARTMENT (DEQUERVAIN);  Surgeon: Kennedy Bucker, MD;  Location: [facility] ORS;  Service: Orthopedics;  Laterality: Right;   MELANOMA EXCISION Left    knee   MICROLARYNGOSCOPY WITH CO2 LASER AND EXCISION OF VOCAL CORD LESION       Current Outpatient Medications  Medication Sig Dispense Refill   aspirin EC 81 MG tablet Take 1 tablet (81 mg total) by mouth daily. Enteric coated     cholecalciferol (VITAMIN D) 1000 units tablet Take 1,000 Units by mouth daily.     citalopram (CELEXA) 20 MG tablet Take 1 tablet (20 mg total) by mouth daily. 90 tablet 2   finasteride (PROPECIA) 1 MG tablet Take 1 tablet (1 mg total) by mouth daily. 90 tablet 3   fluticasone (FLONASE) 50 MCG/ACT nasal spray Place 2 sprays into both nostrils once daily. 16 g 12   HYDROcodone-acetaminophen (NORCO) 5-325 MG tablet Take 1 tablet by mouth every 6 (six) hours as needed for moderate pain. 15 tablet 0   ketoconazole (NIZORAL) 2 % cream Apply 1 application topically to feet at bedtime. 60 g 11   metoprolol tartrate (LOPRESSOR) 25 MG tablet Take 0.5 tablets (12.5 mg total) by mouth  2 (two) times daily. 90 tablet 3   mometasone (ELOCON) 0.1 % cream Apply 1 application topically to affected areas of psoriasis on th elbows 1-2 times daily as needed up to 5 days a week 50 g 5   omega-3 acid ethyl esters (LOVAZA) 1 g capsule Take 2 capsules (2 g total) by mouth 2 (two) times daily 120 capsule 11   omeprazole (PRILOSEC) 20 MG capsule TAKE 1 CAPSULE (20 MG TOTAL) BY MOUTH ONCE DAILY 30 capsule 11   rosuvastatin (CRESTOR) 40 MG tablet Take 1 tablet (40 mg total) by mouth daily. 90 tablet 1   Tavaborole 5 % SOLN Apply to toenails QHS 5d/wk. 10 mL 11   triamcinolone cream (KENALOG) 0.1 % Apply 1 application topically daily. Qd up to 5 days per week until clear to leg for  poison ivy, avoid face, groin, axilla 30 g 1   vardenafil (LEVITRA) 20 MG tablet TAKE 1 TABLET BY MOUTH ONCE DAILY AS NEEDED FOR ERECTILE DYSFUNCTION. 6 tablet 5   No current facility-administered medications for this visit.    Allergies:   Patient has no known allergies.    Social History:  The patient  reports that he has never smoked. He does not have any smokeless tobacco history on file. He reports that he does not drink alcohol and does not use drugs.   Family History:  The patient's family history includes Dementia in his father; Diabetes in his sister; Heart attack in his maternal grandmother, paternal grandfather, and paternal grandmother; Heart attack (age of onset: 42) in his maternal grandfather; Heart disease in his maternal grandfather; Heart disease (age of onset: 63) in his father; Hypertension in his mother.    ROS:  Please see the history of present illness.   Otherwise, review of systems are positive for none.   All other systems are reviewed and negative.    PHYSICAL EXAM: VS:  BP 138/88 (BP Location: Left Arm, Patient Position: Sitting, Cuff Size: Normal)   Pulse 72   Ht 5' 7.5" (1.715 m)   Wt 176 lb (79.8 kg)   SpO2 98%   BMI 27.16 kg/m  , BMI Body mass index is 27.16 kg/m. GEN: Well nourished, well developed, in no acute distress  HEENT: normal  Neck: no JVD, carotid bruits, or masses Cardiac: RRR; no murmurs, rubs, or gallops,no edema  Respiratory:  clear to auscultation bilaterally, normal work of breathing GI: soft, nontender, nondistended, + BS MS: no deformity or atrophy  Skin: warm and dry, no rash Neuro:  Strength and sensation are intact Psych: euthymic mood, full affect   EKG:  EKG is ordered today. The ekg ordered today demonstrates: Normal sinus rhythm Nonspecific ST and T wave abnormality When compared with ECG of 10-Oct-2004 07:26, Criteria for Inferior infarct are no longer Present T wave inversion no longer evident in Inferior leads     Recent Labs: No results found for requested labs within last 365 days.    Lipid Panel No results found for: "CHOL", "TRIG", "HDL", "CHOLHDL", "VLDL", "LDLCALC", "LDLDIRECT"    Wt Readings from Last 3 Encounters:  07/28/23 176 lb (79.8 kg)  02/10/23 174 lb 2 oz (79 kg)  12/04/22 173 lb (78.5 kg)          02/10/2023    9:51 AM  PAD Screen  Previous PAD dx? No  Previous surgical procedure? No  Pain with walking? No  Feet/toe relief with dangling? No  Painful, non-healing ulcers? No  Extremities discolored? No  ASSESSMENT AND PLAN:  1.  Coronary artery disease involving native coronary arteries without angina: He is status post remote CABG. no anginal symptoms.  Continue aspirin 81 mg once daily.  2.  Hyperlipidemia: Most recent lipid profile in July of 2023 showed an LDL of 85 and triglyceride of 94.  Since that time, I increase his rosuvastatin to 40 mg once daily to try to get his LDL below 70.  He is scheduled for a physical in the near future with Dr. Burnett Sheng.    Disposition:   FU with me in 12 months  Signed,  Lorine Bears, MD  07/28/2023 9:37 AM    [facility]

## 2023-07-28 NOTE — Patient Instructions (Signed)
Medication Instructions:  No changes *If you need a refill on your cardiac medications before your next appointment, please call your pharmacy*   Lab Work: None ordered If you have labs (blood work) drawn today and your tests are completely normal, you will receive your results only by: MyChart Message (if you have MyChart) OR A paper copy in the mail If you have any lab test that is abnormal or we need to change your treatment, we will call you to review the results.   Testing/Procedures: None ordered   Follow-Up: At [facility], you and your health needs are our priority.  As part of our continuing mission to provide you with exceptional heart care, we have created designated Provider Care Teams.  These Care Teams include your primary Cardiologist (physician) and Advanced Practice Providers (APPs -  Physician Assistants and Nurse Practitioners) who all work together to provide you with the care you need, when you need it.  We recommend signing up for the patient portal called "MyChart".  Sign up information is provided on this After Visit Summary.  MyChart is used to connect with patients for Virtual Visits (Telemedicine).  Patients are able to view lab/test results, encounter notes, upcoming appointments, etc.  Non-urgent messages can be sent to your provider as well.   To learn more about what you can do with MyChart, go to [email or web address]://[email or web address].    Your next appointment:   12 month(s)  Provider:   You may see Muhammad Arida, MD or one of the following Advanced Practice Providers on your designated Care Team:   Christopher Berge, NP Ryan Dunn, PA-C Cadence Furth, PA-C Sheri Hammock, NP    

## 2023-08-05 DIAGNOSIS — H90A22 Sensorineural hearing loss, unilateral, left ear, with restricted hearing on the contralateral side: Secondary | ICD-10-CM | POA: Diagnosis not present

## 2023-08-11 ENCOUNTER — Other Ambulatory Visit (HOSPITAL_COMMUNITY): Payer: Self-pay

## 2023-09-05 ENCOUNTER — Telehealth: Payer: Self-pay | Admitting: Cardiovascular Disease

## 2023-09-05 MED ORDER — METOPROLOL TARTRATE 25 MG PO TABS: 12.5000 mg | ORAL_TABLET | Freq: Two times a day (BID) | ORAL | 3 refills | Status: DC

## 2023-09-05 NOTE — Telephone Encounter (Signed)
*  STAT* If patient is at the pharmacy, call can be transferred to refill team.   1. Which medications need to be refilled? (please list name of each medication and dose if known)   metoprolol tartrate (LOPRESSOR) 25 MG tablet    2. Which pharmacy/location (including street and city if local pharmacy) is medication to be sent to?[facility] [address] [facility] [phone]  3. Do they need a 30 day or 90 day supply? 90 day

## 2023-09-05 NOTE — Telephone Encounter (Signed)
Requested Prescriptions   Signed Prescriptions Disp Refills  . metoprolol tartrate (LOPRESSOR) 25 MG tablet 90 tablet 3    Sig: Take 0.5 tablets (12.5 mg total) by mouth 2 (two) times daily.    Authorizing Provider: ARIDA, MUHAMMAD A    Ordering User: Jameca Chumley C    

## 2023-10-19 ENCOUNTER — Other Ambulatory Visit (HOSPITAL_COMMUNITY): Payer: Self-pay

## 2023-10-19 ENCOUNTER — Telehealth: Payer: Self-pay | Admitting: Cardiovascular Disease

## 2023-10-19 MED ORDER — ROSUVASTATIN CALCIUM 40 MG PO TABS: 40.0000 mg | ORAL_TABLET | Freq: Every day | ORAL | 3 refills | Status: AC

## 2023-10-19 NOTE — Telephone Encounter (Signed)
*  STAT* If patient is at the pharmacy, call can be transferred to refill team.   1. Which medications need to be refilled? (please list name of each medication and dose if known) rosuvastatin (CRESTOR) 40 MG tablet    2. Would you like to learn more about the convenience, safety, & potential cost savings by using the [facility]?   3. Are you open to using the [facility] (Type [facility]. ).   4. Which pharmacy/location (including street and city if local pharmacy) is medication to be sent to? [facility] [id number] - [place], [place] - [address]    5. Do they need a 30 day or 90 day supply? 90 day

## 2023-12-07 ENCOUNTER — Ambulatory Visit: Payer: Commercial Managed Care - PPO | Admitting: Dermatology

## 2023-12-08 ENCOUNTER — Encounter: Payer: Self-pay | Admitting: Dermatology

## 2023-12-08 ENCOUNTER — Ambulatory Visit: Payer: Medicare Other | Admitting: Dermatology

## 2023-12-08 DIAGNOSIS — W908XXA Exposure to other nonionizing radiation, initial encounter: Secondary | ICD-10-CM | POA: Diagnosis not present

## 2023-12-08 DIAGNOSIS — L409 Psoriasis, unspecified: Secondary | ICD-10-CM

## 2023-12-08 DIAGNOSIS — D2271 Melanocytic nevi of right lower limb, including hip: Secondary | ICD-10-CM

## 2023-12-08 DIAGNOSIS — L821 Other seborrheic keratosis: Secondary | ICD-10-CM

## 2023-12-08 DIAGNOSIS — D1801 Hemangioma of skin and subcutaneous tissue: Secondary | ICD-10-CM

## 2023-12-08 DIAGNOSIS — Z1283 Encounter for screening for malignant neoplasm of skin: Secondary | ICD-10-CM | POA: Diagnosis not present

## 2023-12-08 DIAGNOSIS — Z85820 Personal history of malignant melanoma of skin: Secondary | ICD-10-CM

## 2023-12-08 DIAGNOSIS — L578 Other skin changes due to chronic exposure to nonionizing radiation: Secondary | ICD-10-CM | POA: Diagnosis not present

## 2023-12-08 DIAGNOSIS — L814 Other melanin hyperpigmentation: Secondary | ICD-10-CM | POA: Diagnosis not present

## 2023-12-08 DIAGNOSIS — D239 Other benign neoplasm of skin, unspecified: Secondary | ICD-10-CM

## 2023-12-08 DIAGNOSIS — Z86018 Personal history of other benign neoplasm: Secondary | ICD-10-CM

## 2023-12-08 DIAGNOSIS — L820 Inflamed seborrheic keratosis: Secondary | ICD-10-CM | POA: Diagnosis not present

## 2023-12-08 DIAGNOSIS — D229 Melanocytic nevi, unspecified: Secondary | ICD-10-CM

## 2023-12-08 NOTE — Progress Notes (Signed)
Follow-Up Visit   Subjective  Carlos Blair is a 68 y.o. male who presents for the following: Skin Cancer Screening and Full Body Skin Exam hx of Melanoma, Dysplastic Nevi, Aks, Psoriasis, elbows, Mometasone cr prn flares  The patient presents for Total-Body Skin Exam (TBSE) for skin cancer screening and mole check. The patient has spots, moles and lesions to be evaluated, some may be new or changing and the patient may have concern these could be cancer.    The following portions of the chart were reviewed this encounter and updated as appropriate: medications, allergies, medical history  Review of Systems:  No other skin or systemic complaints except as noted in HPI or Assessment and Plan.  Objective  Well appearing patient in no apparent distress; mood and affect are within normal limits.  A full examination was performed including scalp, head, eyes, ears, nose, lips, neck, chest, axillae, abdomen, back, buttocks, bilateral upper extremities, bilateral lower extremities, hands, feet, fingers, toes, fingernails, and toenails. All findings within normal limits unless otherwise noted below.   Relevant physical exam findings are noted in the Assessment and Plan.  abdomen x 4 (4) Stuck on waxy paps with erythema  Assessment & Plan   SKIN CANCER SCREENING PERFORMED TODAY.  ACTINIC DAMAGE - Chronic condition, secondary to cumulative UV/sun exposure - diffuse scaly erythematous macules with underlying dyspigmentation - Recommend daily broad spectrum sunscreen SPF 30+ to sun-exposed areas, reapply every 2 hours as needed.  - Staying in the shade or wearing long sleeves, sun glasses (UVA+UVB protection) and wide brim hats (4-inch brim around the entire circumference of the hat) are also recommended for sun protection.  - Call for new or changing lesions.  LENTIGINES, SEBORRHEIC KERATOSES, HEMANGIOMAS - Benign normal skin lesions - Benign-appearing - Call for any  changes  MELANOCYTIC NEVI - Tan-brown and/or pink-flesh-colored symmetric macules and papules - Benign appearing on exam today - Observation - Call clinic for new or changing moles - Recommend daily use of broad spectrum spf 30+ sunscreen to sun-exposed areas.  - Tip of R 3rd toe skin colored pap  HISTORY OF MELANOMA - No evidence of recurrence today - No lymphadenopathy - Recommend regular full body skin exams - Recommend daily broad spectrum sunscreen SPF 30+ to sun-exposed areas, reapply every 2 hours as needed.  - Call if any new or changing lesions are noted between office visits  - L lat knee, 06/04/2009 Level III to IV, 0.81 mm   HISTORY OF DYSPLASTIC NEVUS No evidence of recurrence today Recommend regular full body skin exams Recommend daily broad spectrum sunscreen SPF 30+ to sun-exposed areas, reapply every 2 hours as needed.  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  Chronic condition with duration or expected duration over one year. Currently well-controlled.   patient denies joint pain  Psoriasis is a chronic non-curable, but treatable genetic/hereditary disease that may have other systemic features affecting other organ systems such as joints (Psoriatic Arthritis). It is associated with an increased risk of inflammatory bowel disease, heart disease, non-alcoholic fatty liver disease, and depression.  Treatments include light and laser treatments; topical medications; and systemic medications including oral and injectables.  Treatment Plan: Cont Mometasone cr qd up to 5d/wk prn flares, avoid f/g/a  Topical steroids (such as triamcinolone, fluocinolone, fluocinonide, mometasone, clobetasol, halobetasol, betamethasone, hydrocortisone) can cause  thinning and lightening of the skin if they are used for too long in the same area. Your  physician has selected the right strength medicine for your problem and area affected on the body. Please use your medication only as directed by your physician to prevent side effects.    INFLAMED SEBORRHEIC KERATOSIS (4) abdomen x 4 (4) Symptomatic, irritating, patient would like treated. Destruction of lesion - abdomen x 4 (4) Complexity: simple   Destruction method: cryotherapy   Informed consent: discussed and consent obtained   Timeout:  patient name, date of birth, surgical site, and procedure verified Lesion destroyed using liquid nitrogen: Yes   Region frozen until ice ball extended beyond lesion: Yes   Cryo cycles: 1 or 2. Outcome: patient tolerated procedure well with no complications   Post-procedure details: wound care instructions given   MULTIPLE BENIGN NEVI   LENTIGINES   ACTINIC ELASTOSIS   SEBORRHEIC KERATOSES   CHERRY ANGIOMA   DERMATOFIBROMA   PSORIASIS   Related Medications mometasone (ELOCON) 0.1 % cream Apply 1 application topically to affected areas of psoriasis on th elbows 1-2 times daily as needed up to 5 days a week Return in about 6 months (around 06/06/2024) for TBSE, Hx of Melanoma, Hx of Dysplastic nevi, Hx of AKs.  I, Carlos Blair, RMA, am acting as scribe for [name], MD .   Documentation: I have reviewed the above documentation for accuracy and completeness, and I agree with the above.  [name], MD

## 2023-12-08 NOTE — Patient Instructions (Signed)

## 2024-04-11 ENCOUNTER — Ambulatory Visit: Payer: Medicare Other | Admitting: Dermatology

## 2024-04-11 ENCOUNTER — Encounter: Payer: Self-pay | Admitting: Dermatology

## 2024-04-11 DIAGNOSIS — W908XXA Exposure to other nonionizing radiation, initial encounter: Secondary | ICD-10-CM

## 2024-04-11 DIAGNOSIS — L821 Other seborrheic keratosis: Secondary | ICD-10-CM

## 2024-04-11 DIAGNOSIS — L409 Psoriasis, unspecified: Secondary | ICD-10-CM | POA: Diagnosis not present

## 2024-04-11 DIAGNOSIS — L814 Other melanin hyperpigmentation: Secondary | ICD-10-CM

## 2024-04-11 DIAGNOSIS — B351 Tinea unguium: Secondary | ICD-10-CM

## 2024-04-11 DIAGNOSIS — D1801 Hemangioma of skin and subcutaneous tissue: Secondary | ICD-10-CM

## 2024-04-11 DIAGNOSIS — Z79899 Other long term (current) drug therapy: Secondary | ICD-10-CM

## 2024-04-11 DIAGNOSIS — D229 Melanocytic nevi, unspecified: Secondary | ICD-10-CM

## 2024-04-11 DIAGNOSIS — Z1283 Encounter for screening for malignant neoplasm of skin: Secondary | ICD-10-CM

## 2024-04-11 DIAGNOSIS — L820 Inflamed seborrheic keratosis: Secondary | ICD-10-CM

## 2024-04-11 DIAGNOSIS — Z7189 Other specified counseling: Secondary | ICD-10-CM

## 2024-04-11 DIAGNOSIS — L578 Other skin changes due to chronic exposure to nonionizing radiation: Secondary | ICD-10-CM

## 2024-04-11 DIAGNOSIS — B353 Tinea pedis: Secondary | ICD-10-CM

## 2024-04-11 DIAGNOSIS — Z85820 Personal history of malignant melanoma of skin: Secondary | ICD-10-CM

## 2024-04-11 DIAGNOSIS — Z86018 Personal history of other benign neoplasm: Secondary | ICD-10-CM

## 2024-04-11 MED ORDER — KETOCONAZOLE 2 % EX CREA: 1.0000 | TOPICAL_CREAM | Freq: Every day | CUTANEOUS | 11 refills | Status: AC

## 2024-04-11 MED ORDER — TAVABOROLE 5 % EX SOLN: CUTANEOUS | 11 refills | Status: AC

## 2024-04-11 MED ORDER — MOMETASONE FUROATE 0.1 % EX CREA: 1.0000 | TOPICAL_CREAM | CUTANEOUS | 5 refills | Status: AC

## 2024-04-11 NOTE — Progress Notes (Signed)
 Follow-Up Visit   Subjective  Carlos Blair is a 68 y.o. male who presents for the following: Skin Cancer Screening and Full Body Skin Exam hx of Melanoma, Dysplastic nevi, Aks, Psoriasis bil elbows, Mometasone  cr prn, Tinea pedis/Unguium, Ketoconazole  cr, Tavaborole  sol prn, skin tags neck  The patient presents for Total-Body Skin Exam (TBSE) for skin cancer screening and mole check. The patient has spots, moles and lesions to be evaluated, some may be new or changing and the patient may have concern these could be cancer.  The following portions of the chart were reviewed this encounter and updated as appropriate: medications, allergies, medical history  Review of Systems:  No other skin or systemic complaints except as noted in HPI or Assessment and Plan.  Objective  Well appearing patient in no apparent distress; mood and affect are within normal limits.  A full examination was performed including scalp, head, eyes, ears, nose, lips, neck, chest, axillae, abdomen, back, buttocks, bilateral upper extremities, bilateral lower extremities, hands, feet, fingers, toes, fingernails, and toenails. All findings within normal limits unless otherwise noted below.   Relevant physical exam findings are noted in the Assessment and Plan.  face x 18, R chest x 1, back x 3 (22) Stuck on waxy paps with erythema  Assessment & Plan   SKIN CANCER SCREENING PERFORMED TODAY.  ACTINIC DAMAGE - Chronic condition, secondary to cumulative UV/sun exposure - diffuse scaly erythematous macules with underlying dyspigmentation - Recommend daily broad spectrum sunscreen SPF 30+ to sun-exposed areas, reapply every 2 hours as needed.  - Staying in the shade or wearing long sleeves, sun glasses (UVA+UVB protection) and wide brim hats (4-inch brim around the entire circumference of the hat) are also recommended for sun protection.  - Call for new or changing lesions.  LENTIGINES, SEBORRHEIC KERATOSES,  HEMANGIOMAS - Benign normal skin lesions - Benign-appearing - Call for any changes  MELANOCYTIC NEVI - Tan-brown and/or pink-flesh-colored symmetric macules and papules - Benign appearing on exam today - Observation - Call clinic for new or changing moles - Recommend daily use of broad spectrum spf 30+ sunscreen to sun-exposed areas.   HISTORY OF MELANOMA - No evidence of recurrence today - No lymphadenopathy - Recommend regular full body skin exams - Recommend daily broad spectrum sunscreen SPF 30+ to sun-exposed areas, reapply every 2 hours as needed.  - Call if any new or changing lesions are noted between office visits  - L lat knee 2010, Level III to IV, 0.81 mm, superficial spreading   HISTORY OF DYSPLASTIC NEVUS No evidence of recurrence today Recommend regular full body skin exams Recommend daily broad spectrum sunscreen SPF 30+ to sun-exposed areas, reapply every 2 hours as needed.  Call if any new or changing lesions are noted between office visits  - R scapula, L mid lat volar forearm, L sup lat pectoral  HISTORY OF PRECANCEROUS ACTINIC KERATOSIS - site(s) of PreCancerous Actinic Keratosis clear today. - these may recur and new lesions may form requiring treatment to prevent transformation into skin cancer - observe for new or changing spots and contact [facility] for appointment if occur - photoprotection with sun protective clothing; sunglasses and broad spectrum sunscreen with SPF of at least 30 + and frequent self skin exams recommended - yearly exams by a dermatologist recommended for persons with history of PreCancerous Actinic Keratoses   PSORIASIS Bil elbows Exam: Well-demarcated erythematous papules/plaques with silvery scale, guttate pink scaly papules.  2% BSA. Chronic and persistent condition  with duration or expected duration over one year. Condition is symptomatic / bothersome to patient. Not to goal. patient denies joint pain  Psoriasis is a  chronic non-curable, but treatable genetic/hereditary disease that may have other systemic features affecting other organ systems such as joints (Psoriatic Arthritis). It is associated with an increased risk of inflammatory bowel disease, heart disease, non-alcoholic fatty liver disease, and depression.  Treatments include light and laser treatments; topical medications; and systemic medications including oral and injectables.  Treatment Plan: Cont Mometasone  cr qd up to 5d/wk prn flares  Topical steroids (such as triamcinolone , fluocinolone, fluocinonide, mometasone , clobetasol, halobetasol, betamethasone, hydrocortisone) can cause thinning and lightening of the skin if they are used for too long in the same area. Your physician has selected the right strength medicine for your problem and area affected on the body. Please use your medication only as directed by your physician to prevent side effects.   TINEA UNGUIUM / TINEA PEDIS Feet, toenails Exam: toenail dystrophy, scaling feet Chronic and persistent condition with duration or expected duration over one year. Condition is symptomatic/ bothersome to patient. Not currently at goal. Treatment Plan: Cont Tavaborole  sol qhs to toenails Cont Ketoconazole  2% cr qhs to feet   INFLAMED SEBORRHEIC KERATOSIS (22) face x 18, R chest x 1, back x 3 (22) Symptomatic, irritating, patient would like treated. Destruction of lesion - face x 18, R chest x 1, back x 3 (22) Complexity: simple   Destruction method: cryotherapy   Informed consent: discussed and consent obtained   Timeout:  patient name, date of birth, surgical site, and procedure verified Lesion destroyed using liquid nitrogen: Yes   Region frozen until ice ball extended beyond lesion: Yes   Outcome: patient tolerated procedure well with no complications   Post-procedure details: wound care instructions given   TINEA PEDIS OF BOTH FEET   Related Medications Tavaborole  5 % SOLN Apply to  toenails QHS 5d/wk. ketoconazole  (NIZORAL ) 2 % cream Apply 1 application topically to feet at bedtime. PSORIASIS   Related Medications mometasone  (ELOCON ) 0.1 % cream Apply 1 application topically to affected areas of psoriasis on th elbows 1-2 times daily as needed up to 5 days a week Return in about 6 months (around 10/12/2024) for TBSE, Hx of Melanoma, Hx of Dysplastic nevi, Hx of AKs.  I, Rollie Clipper, RMA, am acting as scribe for Celine Collard, MD .   Documentation: I have reviewed the above documentation for accuracy and completeness, and I agree with the above.  Celine Collard, MD

## 2024-04-11 NOTE — Patient Instructions (Addendum)
 Cryotherapy Aftercare  Wash gently with soap and water everyday.   Apply Vaseline and Band-Aid daily until healed.   Your prescription was sent to [facility] in [place]. A representative from [facility] will contact you within 3 business hours to verify your address and insurance information to schedule a free delivery. If for any reason you do not receive a phone call from them, please reach out to them. Their phone number is [phone] and their hours are Monday-Friday 9:00 am-5:00 pm.     Due to recent changes in healthcare laws, you may see results of your pathology and/or laboratory studies on MyChart before the doctors have had a chance to review them. We understand that in some cases there may be results that are confusing or concerning to you. Please understand that not all results are received at the same time and often the doctors may need to interpret multiple results in order to provide you with the best plan of care or course of treatment. Therefore, we ask that you please give [place] 2 business days to thoroughly review all your results before contacting the office for clarification. Should we see a critical lab result, you will be contacted sooner.   If You Need Anything After Your Visit  If you have any questions or concerns for your doctor, please call our main line at [phone] and press option 4 to reach your doctor's medical assistant. If no one answers, please leave a voicemail as directed and we will return your call as soon as possible. Messages left after 4 pm will be answered the following business day.   You may also send [place] a message via MyChart. We typically respond to MyChart messages within 1-2 business days.  For prescription refills, please ask your pharmacy to contact our office. Our fax number is [phone].  If you have an urgent issue when the clinic is closed that cannot wait until the next business day, you can page your doctor at the number  below.    Please note that while we do our best to be available for urgent issues outside of office hours, we are not available 24/7.   If you have an urgent issue and are unable to reach [place], you may choose to seek medical care at your doctor's office, retail clinic, urgent care center, or emergency room.  If you have a medical emergency, please immediately call 911 or go to the emergency department.  Pager Numbers  - Dr. Gwen Pounds: [phone]  - Dr. Roseanne Reno: [phone]  - Dr. Katrinka Blazing: [phone]   In the event of inclement weather, please call our main line at [phone] for an update on the status of any delays or closures.  Dermatology Medication Tips: Please keep the boxes that topical medications come in in order to help keep track of the instructions about where and how to use these. Pharmacies typically print the medication instructions only on the boxes and not directly on the medication tubes.   If your medication is too expensive, please contact our office at [phone] option 4 or send [place] a message through MyChart.   We are unable to tell what your co-pay for medications will be in advance as this is different depending on your insurance coverage. However, we may be able to find a substitute medication at lower cost or fill out paperwork to get insurance to cover a needed medication.   If a prior authorization is required to get your medication covered by your insurance company,  please allow [place] 1-2 business days to complete this process.  Drug prices often vary depending on where the prescription is filled and some pharmacies may offer cheaper prices.  The website www.goodrx.com contains coupons for medications through different pharmacies. The prices here do not account for what the cost may be with help from insurance (it may be cheaper with your insurance), but the website can give you the price if you did not use any insurance.  - You can print the associated  coupon and take it with your prescription to the pharmacy.  - You may also stop by our office during regular business hours and pick up a GoodRx coupon card.  - If you need your prescription sent electronically to a different pharmacy, notify our office through [facility] or by phone at [phone] option 4.     Si Usted Necesita Algo Despus de Su Visita  Tambin puede enviarnos un mensaje a travs de Clinical cytogeneticist. Por lo general respondemos a los mensajes de MyChart en el transcurso de 1 a 2 das hbiles.  Para renovar recetas, por favor pida a su farmacia que se ponga en contacto con nuestra oficina. Annie Sable de fax es [place] [phone].  Si tiene un asunto urgente cuando la clnica est cerrada y que no puede esperar hasta el siguiente da hbil, puede llamar/localizar a su doctor(a) al nmero que aparece a continuacin.   Por favor, tenga en cuenta que aunque hacemos todo lo posible para estar disponibles para asuntos urgentes fuera del horario de Luis M. Cintron, no estamos disponibles las 24 horas del da, los 7 [address]  [address] de la [place].   Si tiene un problema urgente y no puede comunicarse con nosotros, puede optar por buscar atencin mdica  en el consultorio de su doctor(a), en una clnica privada, en un centro de atencin urgente o en una sala de emergencias.  Si tiene Engineer, drilling, por favor llame inmediatamente al 911 o vaya a la sala de emergencias.  Nmeros de bper  - Dr. Gwen Pounds: [phone]  - Dra. Roseanne Reno: [phone]  - Dr. Katrinka Blazing: [phone]   En caso de inclemencias del tiempo, por favor llame a Lacy Duverney principal al [phone] para una actualizacin sobre el [place] de cualquier retraso o cierre.  Consejos para la medicacin en dermatologa: Por favor, guarde las cajas en las que vienen los medicamentos de uso tpico para ayudarle a seguir las instrucciones sobre dnde y cmo usarlos. Las farmacias generalmente imprimen las instrucciones del  medicamento slo en las cajas y no directamente en los tubos del [place].   Si su medicamento es muy caro, por favor, pngase en contacto con Rolm Gala llamando al [phone] y presione la opcin 4 o envenos un mensaje a travs de Clinical cytogeneticist.   No podemos decirle cul ser su copago por los medicamentos por adelantado ya que esto es diferente dependiendo de la cobertura de su seguro. Sin embargo, es posible que podamos encontrar un medicamento sustituto a Audiological scientist un formulario para que el seguro cubra el medicamento que se considera necesario.   Si se requiere una autorizacin previa para que su compaa de seguros [place] su medicamento, por favor permtanos de 1 a 2 das hbiles para completar [address]  Los precios de los medicamentos varan con frecuencia dependiendo del Environmental consultant de dnde se surte la receta y alguna farmacias pueden ofrecer precios ms baratos.  El sitio web www.goodrx.com tiene cupones para medicamentos de Health and safety inspector. Los precios aqu no tienen en cuenta  lo que podra costar con la ayuda del seguro (puede ser ms barato con su seguro), pero el sitio web puede darle el precio si no Visual merchandiser.  - Puede imprimir el cupn correspondiente y llevarlo con su receta a la farmacia.  - Tambin puede pasar por nuestra oficina durante el horario de atencin regular y Education officer, museum una tarjeta de cupones de GoodRx.  - Si necesita que su receta se enve electrnicamente a una farmacia diferente, informe a nuestra oficina a travs de MyChart de [place] o por telfono llamando al [phone] y presione la opcin 4.

## 2024-08-26 ENCOUNTER — Other Ambulatory Visit: Payer: Self-pay | Admitting: Cardiovascular Disease

## 2024-08-29 ENCOUNTER — Telehealth: Payer: Self-pay | Admitting: Cardiovascular Disease

## 2024-08-29 ENCOUNTER — Other Ambulatory Visit: Payer: Self-pay

## 2024-08-29 MED ORDER — METOPROLOL TARTRATE 25 MG PO TABS: 12.5000 mg | ORAL_TABLET | Freq: Two times a day (BID) | ORAL | 0 refills | Status: AC

## 2024-08-29 NOTE — Telephone Encounter (Signed)
 Requested Prescriptions    No prescriptions requested or ordered in this encounter    *STAT* If patient is at the pharmacy, call can be transferred to refill team.   1. Which medications need to be refilled? (please list name of each medication and dose if known) metoprolol  tartrate 25 mg    2. Would you like to learn more about the convenience, safety, & potential cost savings by using the [facility]? **yes**     3. Are you open to using the [facility] (Type [facility].yes   4. Which pharmacy/location (including street and city if local pharmacy) is medication to be sent to?[facility]   5. Do they need a 30 day or 90 day supply? 90 days

## 2024-08-29 NOTE — Telephone Encounter (Signed)
 Called patient to tell him I sent the prescription into [facility]. Patient verbalized understanding.

## 2024-10-17 ENCOUNTER — Ambulatory Visit: Admitting: Dermatology

## 2024-10-30 ENCOUNTER — Ambulatory Visit: Admitting: Cardiovascular Disease

## 2024-12-10 ENCOUNTER — Ambulatory Visit: Admitting: Dermatology

## 2024-12-26 ENCOUNTER — Ambulatory Visit: Admitting: Cardiovascular Disease

## 2025-01-01 ENCOUNTER — Ambulatory Visit: Admitting: Dermatology
